# Patient Record
Sex: Male | Born: 1975 | Race: White | Hispanic: No | Marital: Married | Smoking: Former smoker
Health system: Southern US, Community
[De-identification: ages and names within clinical notes are randomized; demographics above are authoritative.]

## PROBLEM LIST (undated history)

## (undated) DIAGNOSIS — G4733 Obstructive sleep apnea (adult) (pediatric): Secondary | ICD-10-CM

## (undated) HISTORY — DX: Obstructive sleep apnea (adult) (pediatric): G47.33

## (undated) HISTORY — PX: ABDOMINAL PERINEAL BOWEL RESECTION: SHX1111

---

## 1999-05-05 DIAGNOSIS — C19 Malignant neoplasm of rectosigmoid junction: Secondary | ICD-10-CM

## 1999-05-05 HISTORY — DX: Malignant neoplasm of rectosigmoid junction: C19

## 1999-05-05 HISTORY — PX: PORTACATH PLACEMENT: SHX2246

## 2016-06-16 DIAGNOSIS — J029 Acute pharyngitis, unspecified: Secondary | ICD-10-CM | POA: Diagnosis not present

## 2016-06-16 DIAGNOSIS — R05 Cough: Secondary | ICD-10-CM | POA: Diagnosis not present

## 2016-06-16 DIAGNOSIS — J069 Acute upper respiratory infection, unspecified: Secondary | ICD-10-CM | POA: Diagnosis not present

## 2016-06-21 DIAGNOSIS — H60393 Other infective otitis externa, bilateral: Secondary | ICD-10-CM | POA: Diagnosis not present

## 2016-12-22 DIAGNOSIS — B353 Tinea pedis: Secondary | ICD-10-CM | POA: Diagnosis not present

## 2016-12-25 DIAGNOSIS — Z79899 Other long term (current) drug therapy: Secondary | ICD-10-CM | POA: Diagnosis not present

## 2016-12-25 DIAGNOSIS — Z85038 Personal history of other malignant neoplasm of large intestine: Secondary | ICD-10-CM | POA: Diagnosis not present

## 2016-12-25 DIAGNOSIS — B353 Tinea pedis: Secondary | ICD-10-CM | POA: Diagnosis not present

## 2017-05-27 DIAGNOSIS — M25561 Pain in right knee: Secondary | ICD-10-CM | POA: Diagnosis not present

## 2017-05-27 DIAGNOSIS — M779 Enthesopathy, unspecified: Secondary | ICD-10-CM | POA: Diagnosis not present

## 2017-05-27 DIAGNOSIS — B353 Tinea pedis: Secondary | ICD-10-CM | POA: Diagnosis not present

## 2018-04-18 DIAGNOSIS — R454 Irritability and anger: Secondary | ICD-10-CM | POA: Diagnosis not present

## 2018-05-18 DIAGNOSIS — Z13220 Encounter for screening for lipoid disorders: Secondary | ICD-10-CM | POA: Diagnosis not present

## 2018-05-18 DIAGNOSIS — Z0000 Encounter for general adult medical examination without abnormal findings: Secondary | ICD-10-CM | POA: Diagnosis not present

## 2018-09-02 DIAGNOSIS — H60392 Other infective otitis externa, left ear: Secondary | ICD-10-CM | POA: Diagnosis not present

## 2018-09-02 DIAGNOSIS — H6122 Impacted cerumen, left ear: Secondary | ICD-10-CM | POA: Diagnosis not present

## 2018-09-02 DIAGNOSIS — I499 Cardiac arrhythmia, unspecified: Secondary | ICD-10-CM | POA: Diagnosis not present

## 2019-05-05 DIAGNOSIS — I4891 Unspecified atrial fibrillation: Secondary | ICD-10-CM

## 2019-05-05 HISTORY — DX: Unspecified atrial fibrillation: I48.91

## 2019-05-10 ENCOUNTER — Telehealth: Payer: Self-pay | Admitting: *Deleted

## 2019-05-10 NOTE — Telephone Encounter (Addendum)
  needs appt with DOD tomorrow 05/11/19 Received: Today Message Contents  Jerline Pain, MD  [facility] [address] Scheduling; Nuala Alpha, LPN  Hey. Spoke to Arta Silence MD with [facility].  Needs an appt tomorrow with DOD secondary to elevated heart rate post colonoscopy.  He is asymptomatic, no CP.  I asked Dr. Paulita Fujita to give him metoprolol tartrate 25mg  PO BID.  His phone number is [phone].  Please call him after 4:20pm. Thanks.   Candee Furbish, MD

## 2019-05-10 NOTE — Telephone Encounter (Signed)
Left the pt a message to call the office back tomorrow morning for further assistance with getting an appt made on the DOD's schedule, as indicated in this note per Dr. Marlou Porch.  Per Dr. Marlou Porch, pt needed to be placed on Dr. Camillia Herter DOD Schedule for tomorrow 05/11/19, for elevated HR post colonoscopy. Left the pt a detailed message to call the office back for further assistance with this appt.  Once appt is made, a message should be sent to chart prep so that records can be obtained.  Will send this message to Dr. Marlou Porch RN, and Dr. Camillia Herter RN, for further follow-up of this pt when he returns a call back.  Pts contact information is [phone]. Triage to be CC'ed in on this message as well.

## 2019-05-11 ENCOUNTER — Other Ambulatory Visit: Payer: Self-pay

## 2019-05-11 ENCOUNTER — Ambulatory Visit: Payer: 59 | Admitting: Cardiovascular Disease

## 2019-05-11 VITALS — BP 132/90 | HR 86 | Ht 76.0 in | Wt 244.8 lb

## 2019-05-11 DIAGNOSIS — R000 Tachycardia, unspecified: Secondary | ICD-10-CM | POA: Diagnosis not present

## 2019-05-11 NOTE — Telephone Encounter (Signed)
[name] in scheduling dept spoke with the Dylan Bennett this morning and he is scheduled as a new patient, to come in and see DOD Dr. Angelena Form today 1/7 at 3 pm.   Dylan Bennett is aware of appt date and time.  Chart prep was notified to obtain records STAT for Dr. Angelena Form to have for this afternoon when seeing the Dylan Bennett.  Dr. Marlou Porch advised this Dylan Bennett to be added on to today's DOD schedule as a new Dylan Bennett.  Dylan Bennett is being referred by Dr. Paulita Fujita with [facility] for elevated HR post-colonoscopy yesterday.  This information is indicated in this note.  Will forward this information to Dr. Camillia Herter RN as a general FYI, to make her aware of DOD add on for this afternoon.

## 2019-05-11 NOTE — Patient Instructions (Signed)
Medication Instructions:  No changes today *If you need a refill on your cardiac medications before your next appointment, please call your pharmacy*  Lab Work: Today: bmet, tsh If you have labs (blood work) drawn today and your tests are completely normal, you will receive your results only by: Marland Kitchen MyChart Message (if you have MyChart) OR . A paper copy in the mail If you have any lab test that is abnormal or we need to change your treatment, we will call you to review the results.  Testing/Procedures: Your physician has requested that you have an echocardiogram. Echocardiography is a painless test that uses sound waves to create images of your heart. It provides your doctor with information about the size and shape of your heart and how well your heart's chambers and valves are working. This procedure takes approximately one hour. There are no restrictions for this procedure.  Your physician has recommended that you wear a Zio heart monitor. Heart monitors are medical devices that record the heart's electrical activity. Doctors most often use these monitors to diagnose arrhythmias. Arrhythmias are problems with the speed or rhythm of the heartbeat. The monitor is a small, patch. You can wear one while you do your normal daily activities. This is usually used to diagnose what is causing palpitations/syncope (passing out).   Follow-Up: At [facility], you and your health needs are our priority.  As part of our continuing mission to provide you with exceptional heart care, we have created designated Provider Care Teams.  These Care Teams include your primary Cardiologist (physician) and Advanced Practice Providers (APPs -  Physician Assistants and Nurse Practitioners) who all work together to provide you with the care you need, when you need it.  Your next appointment:   4 week(s)  The format for your next appointment:   In Person  Provider:   Lauree Chandler, MD  Other  Instructions

## 2019-05-11 NOTE — Progress Notes (Signed)
Chief Complaint  Patient presents with  . New Patient (Initial Visit)    tachycardia   History of Present Illness:44 yo male with history of colon cancer s/p colectomy in 2001 who is here today as a new patient for evaluation of tachycardia noted during his colonoscopy yesterday. Routine screening colonoscopy yesterday by Dr. Paulita Fujita with [facility]. He completed his bowel prep and had 10 beers the day before the procedure. Noted to have elevated heart rate during procedure. He has a strip printed out that shows a narrow complex irregular rhythm. Difficult to see clear p waves. This appears to be atrial fibrillation. No palpitations at home. He was started on metoprolol 25 mg BID last night. He is feeling well overall. He has some numbness at times in his left arm when driving. No exertional chest pain or dyspnea. He is very active. He drinks beer on a daily basis. No recent tobacco abuse. No thyroid issues. No palpitations at home.   Primary Care Physician: Kieth Brightly  Past Medical History:  Diagnosis Date  . Colorectal cancer ([place]) 2001    Past Surgical History:  Procedure Laterality Date  . ABDOMINAL PERINEAL BOWEL RESECTION      Current Outpatient Medications  Medication Sig Dispense Refill  . amphetamine-dextroamphetamine (ADDERALL XR) 25 MG 24 hr capsule Take 25 mg by mouth every morning.    . Ascorbic Acid (VITAMIN C) 1000 MG tablet Take 1,000 mg by mouth daily.    . metoprolol tartrate (LOPRESSOR) 25 MG tablet Take 25 mg by mouth 2 (two) times daily.    . Multiple Vitamin (MULTIVITAMIN PO) Take 1 tablet by mouth daily.     . sertraline (ZOLOFT) 50 MG tablet Take 50 mg by mouth daily.    . sildenafil (VIAGRA) 100 MG tablet Take 50-100 mg by mouth daily as needed for erectile dysfunction.     No current facility-administered medications for this visit.    Allergies  Allergen Reactions  . Penicillins     rash    Social History   Socioeconomic History  .  Marital status: Married    Spouse name: Not on file  . Number of children: Not on file  . Years of education: Not on file  . Highest education level: Not on file  Occupational History  . Not on file  Tobacco Use  . Smoking status: Former Research scientist (life sciences)  . Smokeless tobacco: Never Used  Substance and Sexual Activity  . Alcohol use: Yes  . Drug use: Never  . Sexual activity: Not on file  Other Topics Concern  . Not on file  Social History Narrative  . Not on file   Social Determinants of Health   Financial Resource Strain:   . Difficulty of Paying Living Expenses: Not on file  Food Insecurity:   . Worried About Charity fundraiser in the Last Year: Not on file  . Ran Out of Food in the Last Year: Not on file  Transportation Needs:   . Lack of Transportation (Medical): Not on file  . Lack of Transportation (Non-Medical): Not on file  Physical Activity:   . Days of Exercise per Week: Not on file  . Minutes of Exercise per Session: Not on file  Stress:   . Feeling of Stress : Not on file  Social Connections:   . Frequency of Communication with Friends and Family: Not on file  . Frequency of Social Gatherings with Friends and Family: Not on file  .  Attends Religious Services: Not on file  . Active Member of Clubs or Organizations: Not on file  . Attends Archivist Meetings: Not on file  . Marital Status: Not on file  Intimate Partner Violence:   . Fear of Current or Ex-Partner: Not on file  . Emotionally Abused: Not on file  . Physically Abused: Not on file  . Sexually Abused: Not on file    Family History  Problem Relation Age of Onset  . Hypotension Mother   . Heart attack Father     Review of Systems:  As stated in the HPI and otherwise negative.   BP 132/90   Pulse 86   Ht 6\' 4"  (1.93 m)   Wt 244 lb 12.8 oz (111 kg)   SpO2 98%   BMI 29.80 kg/m   Physical Examination: General: Well developed, well nourished, NAD  HEENT: OP clear, mucus membranes moist   SKIN: warm, dry. No rashes. Neuro: No focal deficits  Musculoskeletal: Muscle strength 5/5 all ext  Psychiatric: Mood and affect normal  Neck: No JVD, no carotid bruits, no thyromegaly, no lymphadenopathy.  Lungs:Clear bilaterally, no wheezes, rhonci, crackles Cardiovascular: Regular rate and rhythm. No murmurs, gallops or rubs. Abdomen:Soft. Bowel sounds present. Non-tender.  Extremities: No lower extremity edema. Pulses are 2 + in the bilateral DP/PT.  EKG:  EKG is ordered today. The ekg ordered today demonstrates Normal sinus rhythm  Recent Labs: No results found for requested labs within last 8760 hours.   Lipid Panel No results found for: CHOL, TRIG, HDL, CHOLHDL, VLDL, LDLCALC, LDLDIRECT   Wt Readings from Last 3 Encounters:  05/11/19 244 lb 12.8 oz (111 kg)       Assessment and Plan:   1. Tachycardia: Limited strip from GI reviewed. Irregular narrow complex rhythm. This appears to be atrial fibrillation. CHADS VASC score 0. Will arrange 14 day cardiac monitor. Will arrange echo. Will arrange BMET and TSH.   Current medicines are reviewed at length with the patient today.  The patient does not have concerns regarding medicines.  The following changes have been made:  no change  Labs/ tests ordered today include:   Orders Placed This Encounter  Procedures  . LONG TERM MONITOR (3-14 DAYS)  . EKG 12-Lead  . ECHOCARDIOGRAM COMPLETE     Disposition:   FU with me in 4 weeks.    Signed, Lauree Chandler, MD 05/11/2019 4:01 PM    [facility] [address], [place], [place]  [address] Phone: [phone]; Fax: [phone]

## 2019-05-12 ENCOUNTER — Ambulatory Visit (HOSPITAL_COMMUNITY): Payer: 59 | Attending: Internal Medicine

## 2019-05-12 ENCOUNTER — Other Ambulatory Visit (HOSPITAL_COMMUNITY): Payer: 59

## 2019-05-12 DIAGNOSIS — R000 Tachycardia, unspecified: Secondary | ICD-10-CM | POA: Diagnosis present

## 2019-05-17 ENCOUNTER — Ambulatory Visit: Payer: Self-pay | Admitting: Cardiovascular Disease

## 2019-05-17 ENCOUNTER — Other Ambulatory Visit (INDEPENDENT_AMBULATORY_CARE_PROVIDER_SITE_OTHER): Payer: 59

## 2019-05-17 DIAGNOSIS — R000 Tachycardia, unspecified: Secondary | ICD-10-CM | POA: Diagnosis not present

## 2019-05-19 ENCOUNTER — Other Ambulatory Visit: Payer: Self-pay | Admitting: *Deleted

## 2019-05-19 DIAGNOSIS — I77810 Thoracic aortic ectasia: Secondary | ICD-10-CM

## 2019-06-08 ENCOUNTER — Telehealth: Payer: Self-pay | Admitting: Cardiovascular Disease

## 2019-06-08 NOTE — Telephone Encounter (Signed)
New Message     Pt is calling and is wanting to know if his appt on 02/11 can be virtual because he is traveling around for work and just started this new job     Please advise

## 2019-06-08 NOTE — Telephone Encounter (Signed)
Message to medical records to get update on 14 day monitor.

## 2019-06-09 ENCOUNTER — Other Ambulatory Visit: Payer: Self-pay

## 2019-06-09 ENCOUNTER — Other Ambulatory Visit: Payer: 59 | Admitting: *Deleted

## 2019-06-09 ENCOUNTER — Telehealth: Payer: Self-pay | Admitting: Cardiovascular Disease

## 2019-06-09 DIAGNOSIS — R000 Tachycardia, unspecified: Secondary | ICD-10-CM

## 2019-06-09 NOTE — Telephone Encounter (Signed)
Spoke with Raquel Sarna at [facility] who reviewed that ZIO report is available on line.  Pt wore zio from Jan 13-26.  He was in afib/flutter 33% of the time.  Fastest was afib 275 bpm on 1/17 at 1:08 pm (this is on strip 7)  Last visit note indciates CHADS VASC is 0.  Pt scheduled with Dr. Angelena Form on 06/12/19. His echo is done but I do not see lab results. Called patient.  He will come by today for bmet, tsh.  Requests when he is here to get copy of progress note from visit on 1/7 and echo report on 1/8.  Adv him to ask for medical records.  Will send them a message to see if they can have ready for this afternoon.

## 2019-06-09 NOTE — Telephone Encounter (Signed)
Raquel Sarna, with [employer], is calling to read abnormal Zio Patch findings. Please return call at [phone].   Reference #: [id number]

## 2019-06-09 NOTE — Telephone Encounter (Signed)
Pt aware I will review w Dr. Angelena Form and will let him know.

## 2019-06-10 LAB — BASIC METABOLIC PANEL
BUN/Creatinine Ratio: 20 (ref 9–20)
BUN: 24 mg/dL (ref 6–24)
CO2: 21 mmol/L (ref 20–29)
Calcium: 9.2 mg/dL (ref 8.7–10.2)
Chloride: 110 mmol/L — ABNORMAL HIGH (ref 96–106)
Creatinine, Ser: 1.23 mg/dL (ref 0.76–1.27)
GFR calc Af Amer: 82 mL/min/{1.73_m2} (ref >59)
GFR calc non Af Amer: 71 mL/min/{1.73_m2} (ref >59)
Glucose: 79 mg/dL (ref 65–99)
Potassium: 3.9 mmol/L (ref 3.5–5.2)
Sodium: 142 mmol/L (ref 134–144)

## 2019-06-10 LAB — TSH: TSH: 1.04 u[IU]/mL (ref 0.450–4.500)

## 2019-06-11 NOTE — Telephone Encounter (Signed)
A virtual visit will be ok. I can review his monitor and labs with him then.

## 2019-06-12 ENCOUNTER — Ambulatory Visit: Payer: 59 | Admitting: Cardiovascular Disease

## 2019-06-12 NOTE — Telephone Encounter (Signed)
Contacted patient and informed virtual visit is ok with Dr. Angelena Form.  Explained process for virtual visit (VIDEO). Pt appreciative for this.     Virtual Visit Pre-Appointment Phone Call  "(Name), I am calling you today to discuss your upcoming appointment. We are currently trying to limit exposure to the virus that causes COVID-19 by seeing patients at home rather than in the office."  1. "What is the BEST phone number to call the day of the visit?" - include this in appointment notes  2. "Do you have or have access to (through a family member/friend) a smartphone with video capability that we can use for your visit?" a. If yes - list this number in appt notes as "cell" (if different from BEST phone #) and list the appointment type as a VIDEO visit in appointment notes b. If no - list the appointment type as a PHONE visit in appointment notes  3. Confirm consent - "In the setting of the current Covid19 crisis, you are scheduled for a (phone or video) visit with your provider on (date) at (time).  Just as we do with many in-office visits, in order for you to participate in this visit, we must obtain consent.  If you'd like, I can send this to your mychart (if signed up) or email for you to review.  Otherwise, I can obtain your verbal consent now.  All virtual visits are billed to your insurance company just like a normal visit would be.  By agreeing to a virtual visit, we'd like you to understand that the technology does not allow for your provider to perform an examination, and thus may limit your provider's ability to fully assess your condition. If your provider identifies any concerns that need to be evaluated in person, we will make arrangements to do so.  Finally, though the technology is pretty good, we cannot assure that it will always work on either your or our end, and in the setting of a video visit, we may have to convert it to a phone-only visit.  In either situation, we cannot ensure  that we have a secure connection.  Are you willing to proceed?" STAFF: Did the patient verbally acknowledge consent to telehealth visit? Document YES/NO here: YES  4. Advise patient to be prepared - "Two hours prior to your appointment, go ahead and check your blood pressure, pulse, oxygen saturation, and your weight (if you have the equipment to check those) and write them all down. When your visit starts, your provider will ask you for this information. If you have an Apple Watch or Kardia device, please plan to have heart rate information ready on the day of your appointment. Please have a pen and paper handy nearby the day of the visit as well."  5. Give patient instructions for MyChart download to smartphone OR [email or web address] as below if video visit (depending on what platform provider is using)  6. Inform patient they will receive a phone call 15 minutes prior to their appointment time (may be from unknown caller ID) so they should be prepared to answer    TELEPHONE CALL NOTE  Dylan Bennett has been deemed a candidate for a follow-up tele-health visit to limit community exposure during the Covid-19 pandemic. I spoke with the patient via phone to ensure availability of phone/video source, confirm preferred email & phone number, and discuss instructions and expectations.  I reminded Dylan Bennett to be prepared with any vital sign and/or heart rhythm information that could potentially be  obtained via home monitoring, at the time of his visit. I reminded Dylan Bennett to expect a phone call prior to his visit.  [name], RN 06/12/2019 9:42 AM   IF USING DOXIMITY or DOXY.ME - The patient will receive a link just prior to their visit by text.     FULL LENGTH CONSENT FOR TELE-HEALTH VISIT   I hereby voluntarily request, consent and authorize [facility] and its employed or contracted physicians, physician assistants, nurse practitioners or other licensed health care  professionals (the Practitioner), to provide me with telemedicine health care services (the "Services") as deemed necessary by the treating Practitioner. I acknowledge and consent to receive the Services by the Practitioner via telemedicine. I understand that the telemedicine visit will involve communicating with the Practitioner through live audiovisual communication technology and the disclosure of certain medical information by electronic transmission. I acknowledge that I have been given the opportunity to request an in-person assessment or other available alternative prior to the telemedicine visit and am voluntarily participating in the telemedicine visit.  I understand that I have the right to withhold or withdraw my consent to the use of telemedicine in the course of my care at any time, without affecting my right to future care or treatment, and that the Practitioner or I may terminate the telemedicine visit at any time. I understand that I have the right to inspect all information obtained and/or recorded in the course of the telemedicine visit and may receive copies of available information for a reasonable fee.  I understand that some of the potential risks of receiving the Services via telemedicine include:  Marland Kitchen Delay or interruption in medical evaluation due to technological equipment failure or disruption; . Information transmitted may not be sufficient (e.g. poor resolution of images) to allow for appropriate medical decision making by the Practitioner; and/or  . In rare instances, security protocols could fail, causing a breach of personal health information.  Furthermore, I acknowledge that it is my responsibility to provide information about my medical history, conditions and care that is complete and accurate to the best of my ability. I acknowledge that Practitioner's advice, recommendations, and/or decision may be based on factors not within their control, such as incomplete or inaccurate  data provided by me or distortions of diagnostic images or specimens that may result from electronic transmissions. I understand that the practice of medicine is not an exact science and that Practitioner makes no warranties or guarantees regarding treatment outcomes. I acknowledge that I will receive a copy of this consent concurrently upon execution via email to the email address I last provided but may also request a printed copy by calling the office of [facility].    I understand that my insurance will be billed for this visit.   I have read or had this consent read to me. . I understand the contents of this consent, which adequately explains the benefits and risks of the Services being provided via telemedicine.  . I have been provided ample opportunity to ask questions regarding this consent and the Services and have had my questions answered to my satisfaction. . I give my informed consent for the services to be provided through the use of telemedicine in my medical care  By participating in this telemedicine visit I agree to the above.

## 2019-06-15 ENCOUNTER — Encounter: Payer: Self-pay | Admitting: Cardiovascular Disease

## 2019-06-15 ENCOUNTER — Other Ambulatory Visit: Payer: Self-pay

## 2019-06-15 ENCOUNTER — Telehealth (INDEPENDENT_AMBULATORY_CARE_PROVIDER_SITE_OTHER): Payer: 59 | Admitting: Cardiovascular Disease

## 2019-06-15 VITALS — Ht 76.0 in | Wt 240.0 lb

## 2019-06-15 DIAGNOSIS — I4891 Unspecified atrial fibrillation: Secondary | ICD-10-CM

## 2019-06-15 DIAGNOSIS — I480 Paroxysmal atrial fibrillation: Secondary | ICD-10-CM | POA: Diagnosis not present

## 2019-06-15 NOTE — Patient Instructions (Signed)
Medication Instructions:  No changes today *If you need a refill on your cardiac medications before your next appointment, please call your pharmacy*  Lab Work: none If you have labs (blood work) drawn today and your tests are completely normal, you will receive your results only by: Marland Kitchen MyChart Message (if you have MyChart) OR . A paper copy in the mail If you have any lab test that is abnormal or we need to change your treatment, we will call you to review the results.  Testing/Procedures: none   Other Instructions You have been referred to Electrophysiology --Dr. [name]

## 2019-06-15 NOTE — Progress Notes (Signed)
Virtual Visit via Video Note   This visit type was conducted due to national recommendations for restrictions regarding the COVID-19 Pandemic (e.g. social distancing) in an effort to limit this patient's exposure and mitigate transmission in our community.  Due to his co-morbid illnesses, this patient is at least at moderate risk for complications without adequate follow up.  This format is felt to be most appropriate for this patient at this time.  All issues noted in this document were discussed and addressed.  A limited physical exam was performed with this format.  Please refer to the patient's chart for his consent to telehealth for [facility].   Date:  06/15/2019   ID:  Dylan Bennett, DOB 1976/04/12, MRN [id number]  Patient Location: Home Provider Location: Office  PCP:  Aurea Graff, PA-C  Cardiologist:  No primary care provider on file.  Electrophysiologist:  None   Evaluation Performed:  Follow-Up Visit  Chief Complaint:  Follow up- Atrial fibrillation  History of Present Illness:    Dylan Bennett is a 44 y.o. male with history of colon cancer s/p colectomy in 2001 and recent finding of atrial fibrillation who is being seen today by virtual e-visit due to the Covid19 pandemic. He was found to have atrial fibrillation during his colonoscopy on 05/10/19. He had 10 beers the day before his procedure while doing his bowel prep. He was started on metoprolol 25 mg BID. He had no worrisome symptoms when I saw him last month. Echo 05/12/19 with LVEF=50-55%,  No valve disease.  Cardiac monitor with atrial fib (33% burden) but also nocturnal bradycardia with heart rate into the 40s. Some of the rhythm strips are narrow complex with a rate over 250 bpm, Cannot exclude SVT.   The patient denies chest pain, dyspnea, palpitations, dizziness, near syncope or syncope. No lower extremity edema. He has been working. He has no awareness during the day that his heart is irregular. He does  notice his heart skipping some at night. He takes Adderall durin the week.   The patient does not have symptoms concerning for COVID-19 infection (fever, chills, cough, or new shortness of breath).    Past Medical History:  Diagnosis Date  . Atrial fibrillation ([place])   . Colorectal cancer ([place]) 2001   Past Surgical History:  Procedure Laterality Date  . ABDOMINAL PERINEAL BOWEL RESECTION       Current Meds  Medication Sig  . amphetamine-dextroamphetamine (ADDERALL XR) 25 MG 24 hr capsule Take 25 mg by mouth every morning.  . Ascorbic Acid (VITAMIN C) 1000 MG tablet Take 1,000 mg by mouth daily.  . Multiple Vitamin (MULTIVITAMIN PO) Take 1 tablet by mouth daily.   . sertraline (ZOLOFT) 50 MG tablet Take 50 mg by mouth daily.  . sildenafil (VIAGRA) 100 MG tablet Take 50-100 mg by mouth daily as needed for erectile dysfunction.     Allergies:   Penicillins   Social History   Tobacco Use  . Smoking status: Former Research scientist (life sciences)  . Smokeless tobacco: Never Used  Substance Use Topics  . Alcohol use: Yes  . Drug use: Never     Family Hx: The patient's family history includes Heart attack in his father; Hypotension in his mother.  ROS:   Please see the history of present illness.    All other systems reviewed and are negative.   Prior CV studies:   The following studies were reviewed today:  Echo 05/12/19:  1. Left ventricular ejection fraction,  by visual estimation, is 50 to  55%. The left ventricle has low normal function. There is no left  ventricular hypertrophy. GLS -18.4%.  2. Left ventricular diastolic parameters are consistent with Grade I  diastolic dysfunction (impaired relaxation).  3. Mildly dilated left ventricular internal cavity size.  4. The left ventricle has no regional wall motion abnormalities.  5. Global right ventricle has normal systolic function.The right  ventricular size is normal. No increase in right ventricular wall  thickness.  6. Left  atrial size was normal.  7. Right atrial size was normal.  8. The mitral valve is normal in structure. Trivial mitral valve  regurgitation. No evidence of mitral stenosis.  9. The tricuspid valve is normal in structure.  10. The aortic valve is tricuspid. Aortic valve regurgitation is not  visualized. No evidence of aortic valve sclerosis or stenosis.  11. There is mild dilatation of the ascending aorta measuring 40 mm.  12. The tricuspid regurgitant velocity is 2.09 m/s, and with an assumed  right atrial pressure of 3 mmHg, the estimated right ventricular systolic  pressure is normal at 20.5 mmHg.  13. The inferior vena cava is normal in size with greater than 50%  respiratory variability, suggesting right atrial pressure of 3 mmHg.   Labs/Other Tests and Data Reviewed:    EKG:  No ECG reviewed.  Recent Labs: 06/09/2019: BUN 24; Creatinine, Ser 1.23; Potassium 3.9; Sodium 142; TSH 1.040   Recent Lipid Panel No results found for: CHOL, TRIG, HDL, CHOLHDL, LDLCALC, LDLDIRECT  Wt Readings from Last 3 Encounters:  06/15/19 240 lb (108.9 kg)  05/11/19 244 lb 12.8 oz (111 kg)     Objective:    Vital Signs:  Ht 6\' 4"  (1.93 m)   Wt 240 lb (108.9 kg)   BMI 29.21 kg/m    VITAL SIGNS:  reviewed GEN:    ASSESSMENT & PLAN:    1. Atrial fibrillation, paroxysmal: Recent cardiac monitor with 33% burden of atrial fibrillation. Cannot exclude SVT with some rates over 250 bpm.  He has nocturnal bradycardia with heart rates in the 40s. He has not used his metoprolol over the past month. LV function normal on echo. Given his tachycardia with nighttime bradycardia, I am going to refer him to EP to discuss options. He is not symptomatic with his atrial arrhythmia. CHADS  VASC score of 0. Anti-coagulation is not indicated.   COVID-19 Education: The signs and symptoms of COVID-19 were discussed with the patient and how to seek care for testing (follow up with PCP or arrange E-visit).  The  importance of social distancing was discussed today.  Time:   Today, I have spent 15 minutes with the patient with telehealth technology discussing the above problems.     Medication Adjustments/Labs and Tests Ordered: Current medicines are reviewed at length with the patient today.  Concerns regarding medicines are outlined above.   Tests Ordered: Orders Placed This Encounter  Procedures  . Ambulatory referral to Cardiac Electrophysiology    Medication Changes: No orders of the defined types were placed in this encounter.   Disposition:  Will arrange EP consultation  Signed, Lauree Chandler, MD  06/15/2019 4:55 PM    [place]

## 2019-06-22 ENCOUNTER — Other Ambulatory Visit: Payer: Self-pay

## 2019-06-22 ENCOUNTER — Telehealth: Payer: 59 | Admitting: Internal Medicine

## 2019-06-26 ENCOUNTER — Telehealth (INDEPENDENT_AMBULATORY_CARE_PROVIDER_SITE_OTHER): Payer: 59 | Admitting: Internal Medicine

## 2019-06-26 ENCOUNTER — Other Ambulatory Visit: Payer: Self-pay

## 2019-06-26 ENCOUNTER — Encounter: Payer: Self-pay | Admitting: Internal Medicine

## 2019-06-26 VITALS — Ht 76.0 in | Wt 240.0 lb

## 2019-06-26 DIAGNOSIS — R0683 Snoring: Secondary | ICD-10-CM

## 2019-06-26 DIAGNOSIS — I480 Paroxysmal atrial fibrillation: Secondary | ICD-10-CM

## 2019-06-26 MED ORDER — DILTIAZEM HCL ER COATED BEADS 120 MG PO CP24: 120.0000 mg | ORAL_CAPSULE | Freq: Every day | ORAL | 3 refills | Status: DC

## 2019-06-26 NOTE — Progress Notes (Signed)
Electrophysiology TeleHealth Note   Due to national recommendations of social distancing due to [place] 19, Audio telehealth visit is felt to be most appropriate for this patient at this time.  See MyChart message from today for patient consent regarding telehealth for [facility].   Date:  06/26/2019   ID:  Dylan Bennett, DOB 07-10-1975, MRN [id number]  Location: home Provider location: [place] Evaluation Performed: New patient consult  PCP:  Aurea Graff, PA-C  Cardiologist:  Dr Angelena Form Electrophysiologist:  None   Chief Complaint:  Atrial fibrillation  History of Present Illness:    Dylan Bennett is a 44 y.o. male who presents via audio conferencing for a telehealth visit today.   The patient is referred for new consultation regarding atrial fibrillation by Dr Angelena Form.   The patient has a h/o colon cancer in 2001.  He presented in January for routine colonoscopy and was noted on monitor to have afib.  He was referred to Dr Angelena Form and had 30 day monitor placed.  This demonstrated 33% afib burden with very elevated V rates and a regular tachycardia at 280 bpm.  He has a physical job and is very active.  He denies exertional limitation.  He is aware of occasional palpitations.     Today, he denies symptoms of chest pain, shortness of breath, lower extremity edema, dizziness, presyncope, syncope, bleeding, or neurologic sequela. The patient is tolerating medications without difficulties and is otherwise without complaint today.     Past Medical History:  Diagnosis Date  . Atrial fibrillation ([place]) 05/2019  . Colorectal cancer ([place]) 2001   treated with colon resection and permanent coloscopy.  He also had radiation and chemotherapy    Past Surgical History:  Procedure Laterality Date  . ABDOMINAL PERINEAL BOWEL RESECTION    . PORTACATH PLACEMENT  2001    Current Outpatient Medications  Medication Sig Dispense Refill  . amphetamine-dextroamphetamine  (ADDERALL XR) 25 MG 24 hr capsule Take 25 mg by mouth every morning.    . Ascorbic Acid (VITAMIN C) 1000 MG tablet Take 1,000 mg by mouth daily.    . Multiple Vitamin (MULTIVITAMIN PO) Take 1 tablet by mouth daily.     . sertraline (ZOLOFT) 50 MG tablet Take 50 mg by mouth daily.    . sildenafil (VIAGRA) 100 MG tablet Take 50-100 mg by mouth daily as needed for erectile dysfunction.    Marland Kitchen diltiazem (CARDIZEM CD) 120 MG 24 hr capsule Take 1 capsule (120 mg total) by mouth daily. 90 capsule 3   No current facility-administered medications for this visit.    Allergies:   Penicillins   Social History:  The patient  reports that he has quit smoking. He has never used smokeless tobacco. He reports current alcohol use. He reports that he does not use drugs.   Family History:  The patient's  family history includes Heart attack in his father; Hypotension in his mother.    ROS:  Please see the history of present illness.   All other systems are personally reviewed and negative.    Exam:    Vital Signs:  Ht 6\' 4"  (1.93 m)   Wt 240 lb (108.9 kg)   BMI 29.21 kg/m    Well sounding, alert and conversant, he sounds distracted and several times states that he is busy and does not have a lot of time for things because of work   Labs/Other Tests and Data Reviewed:    Recent Labs: 06/09/2019: BUN  24; Creatinine, Ser 1.23; Potassium 3.9; Sodium 142; TSH 1.040   Wt Readings from Last 3 Encounters:  06/26/19 240 lb (108.9 kg)  06/15/19 240 lb (108.9 kg)  05/11/19 244 lb 12.8 oz (111 kg)     Other studies personally reviewed: Additional studies/ records that were reviewed today include: Dr Camillia Herter notes,  Recent echo and event monitor Review of the above records today demonstrates: as above  Event monitor is personally reviewed which reveals afib burden of 33%.  V rates are frequently elevated with average V rate during afib of 128 bpm.  There is also a very regular tachycardia, either due to  SVT or 1:1 atrial flutter with V rates of 280 bpm.  Nocturnal bradycardia is also noted.  ASSESSMENT & PLAN:    1.  paroxsymal atrial fibrillation Event monitor is reviewed which reveals afib burden of 33%.  V rates are frequently elevated with average V rate during afib of 128 bpm.  There is also a very regular tachycardia, either due to SVT or 1:1 atrial flutter with V rates of 280 bpm.   We discussed options of rate control, AADs, and ablation at length today.  He states "I cannot take off of work right now for any procedures.  I just started a new job. Start diltiazem CD 120mg  daily.  Consider ILR vs [employer] depending on his level of interested at follow-up.  I would not advise additional 30 day monitors. We could also consider flecainide depending on his tolerance of diltiazem. Eventually, he may be willing to consider ablation. We discussed lifestyle modification at length today including ETOH reduction and evaluation snoring.  2. Snoring He snores and has nocturnal bradycardia Will order a sleep study.  He would prefer a home sleep study given his work requirements.  3 ETOH avoidance encouraged  Follow-up:  6 weeks in the AF clnic  Current medicines are reviewed at length with the patient today.   The patient does not have concerns regarding his medicines.  The following changes were made today:  none  Labs/ tests ordered today include:  No orders of the defined types were placed in this encounter.   Patient Risk:  after full review of this patients clinical status, I feel that they are at moderate risk at this time.   Today, I have spent 20 minutes with the patient with telehealth technology discussing afib .    Army Fossa MD, [facility] 06/26/2019 3:00 PM   [place] [address] [place] [address] [phone] (office) [phone] (fax)

## 2019-06-27 ENCOUNTER — Telehealth: Payer: Self-pay

## 2019-06-27 DIAGNOSIS — R0683 Snoring: Secondary | ICD-10-CM

## 2019-06-27 NOTE — Telephone Encounter (Signed)
-----   Message from [name], MD sent at 06/26/2019  2:46 PM EST ----- Sonia Baller, Please order a sleep study, ok to do with Dr Radford Pax.  Either take home or in clinic... up to her  Dylan Bennett, Follow-up in AF clinic in 6 weeks

## 2019-06-28 NOTE — Telephone Encounter (Signed)
Left detailed message for Pt.  Need to determine if Pt would like to do a HOME SLEEP STUDY or an [place].  Advised Pt to call back to office and advise which type Pt would like.  Will follow up next day in office (Friday 2/26).

## 2019-07-05 ENCOUNTER — Telehealth (HOSPITAL_COMMUNITY): Payer: Self-pay | Admitting: Nurse Practitioner

## 2019-07-05 NOTE — Telephone Encounter (Signed)
Call placed to Pt.  Pt to busy to talk right now, but is interested in [place] sleep study.  Sent Pt MyChart link to set up.  Will follow up.  Need STOPBANG questions.

## 2019-07-05 NOTE — Telephone Encounter (Signed)
Per staff message from Myrtie Hawk, RN, pt needs 6 wk f/u with AFib Clinic from his 06/26/19 Virtual appt with Dr. Rayann Heman

## 2019-07-10 NOTE — Telephone Encounter (Signed)
Pt has smart phone and would like to use disposable sleep test.    Patient Name: Dylan Bennett        DOB: 1976/02/25      Height: 6'4"    Weight: 240 lbs.  Office Name: [facility]         Referring Provider:  [name], MD  Today's Date:  07/10/2019  Date:  07/10/2019 STOP BANG RISK ASSESSMENT S (snore) Have you been told that you snore?     YES   T (tired) Are you often tired, fatigued, or sleepy during the day?   NO  O (obstruction) Do you stop breathing, choke, or gasp during sleep? NO   P (pressure) Do you have or are you being treated for high blood pressure? NO   B (BMI) Is your body index greater than 35 kg/m? NO   A (age) Are you 34 years old or older? NO   N (neck) Do you have a neck circumference greater than 16 inches?   YES   G (gender) Are you a male? YES   TOTAL STOP/BANG "YES" ANSWERS                                                                        For Office Use Only              Procedure Order Form    YES to 3+ Stop Bang questions OR two clinical symptoms - patient qualifies for WatchPAT (CPT 95800)     Submit: This Form + Patient Face Sheet + Clinical Note via CloudPAT or Fax: [phone]         Clinical Notes: Will consult Sleep Specialist and refer for management of therapy due to patient increased risk of Sleep Apnea. Ordering a sleep study due to the following two clinical symptoms: Excessive daytime sleepiness G47.10 / Gastroesophageal reflux K21.9 / Nocturia R35.1 / Morning Headaches G44.221 / Difficulty concentrating R41.840 / Memory problems or poor judgment G31.84 / Personality changes or irritability R45.4 / Loud snoring R06.83 / Depression F32.9 / Unrefreshed by sleep G47.8 / Impotence N52.9 / History of high blood pressure R03.0 / Insomnia G47.00    I understand that I am proceeding with a home sleep apnea test as ordered by my treating physician. I understand that untreated sleep apnea is a serious cardiovascular risk factor  and it is my responsibility to perform the test and seek management for sleep apnea. I will be contacted with the results and be managed for sleep apnea by a local sleep physician. I will be receiving equipment and further instructions from [facility]. I shall promptly ship back the equipment via the included mailing label. I understand my insurance will be billed for the test and as the patient I am responsible for any insurance related out-of-pocket costs incurred. I have been provided with written instructions and can call for additional video or telephonic instruction, with 24-hour availability of qualified personnel to answer any questions: Patient Help Desk [phone].  Patient Signature ______________________________________________________   Date______________________ Patient Telemedicine Verbal Consent

## 2019-07-10 NOTE — Telephone Encounter (Signed)
Left message to call this nurse back.  Need to discuss stopbang questionairre with Pt.

## 2019-07-11 NOTE — Telephone Encounter (Signed)
Order, demographic, stopbang questionairre and clinical note faxed to [employer].

## 2019-07-11 NOTE — Telephone Encounter (Signed)
Pt has not responded to previous VM.  Called and left 2nd message for patient to call [facility] to schedule appt.

## 2019-07-11 NOTE — Telephone Encounter (Signed)
Pt returned my call and is aware of appt 4/8 @3 :30 with Roderic Palau, NP.

## 2019-07-12 NOTE — Telephone Encounter (Signed)
Spoke with pt and he is aware of appt on 08/10/19 with [facility].

## 2019-08-10 ENCOUNTER — Ambulatory Visit (HOSPITAL_COMMUNITY): Payer: 59 | Admitting: Nurse Practitioner

## 2019-08-18 ENCOUNTER — Other Ambulatory Visit: Payer: Self-pay

## 2019-08-18 ENCOUNTER — Ambulatory Visit (HOSPITAL_COMMUNITY)
Admission: RE | Admit: 2019-08-18 | Discharge: 2019-08-18 | Disposition: A | Discharge status: Home / Self Care | Payer: 59 | Source: Ambulatory Visit | Attending: Nurse Practitioner | Admitting: Nurse Practitioner

## 2019-08-18 VITALS — BP 150/86 | HR 125 | Ht 76.0 in | Wt 224.6 lb

## 2019-08-18 DIAGNOSIS — I480 Paroxysmal atrial fibrillation: Secondary | ICD-10-CM

## 2019-08-18 DIAGNOSIS — D6869 Other thrombophilia: Secondary | ICD-10-CM | POA: Diagnosis not present

## 2019-08-18 DIAGNOSIS — Z79899 Other long term (current) drug therapy: Secondary | ICD-10-CM | POA: Insufficient documentation

## 2019-08-18 DIAGNOSIS — Z87891 Personal history of nicotine dependence: Secondary | ICD-10-CM | POA: Insufficient documentation

## 2019-08-18 MED ORDER — FLECAINIDE ACETATE 50 MG PO TABS: 50.0000 mg | ORAL_TABLET | Freq: Two times a day (BID) | ORAL | 3 refills | Status: DC

## 2019-08-18 NOTE — Progress Notes (Signed)
Primary Care Physician: Aurea Graff, PA-C Referring Physician: Dr. Lamount Cohen Cardiologist: Dr. Kavin Leech Dylan Bennett is a 44 y.o. male with a h/o paroxymal afib that is here f/u from consult with Dr.Allred 06/26/19, for 33 % burden on monitor,with very elevated V rates and a regular tachycardia at 280 bpm.  He recommended ablation or flecainide if he tolerated the diltiazem that pt was placed on that visit. Pt did not want ablation at the time.   Today, he is in afib with v rate mid 120's. He is unaware. He has tolerated the diltiazem.  He obtained the home sleep study but he has not done this yet. Wife states witnessed snoring and apnea. He is not drinking excessive caffeine, but does drink 6+ beers a week maybe more on the weekend. Dr. Rayann Heman recommended getting a apple watch, KardiaMobile to track degree of afib, but he did not feel compelled to purchase. .   Today, he denies symptoms of palpitations, chest pain, shortness of breath, orthopnea, PND, lower extremity edema, dizziness, presyncope, syncope, or neurologic sequela. The patient is tolerating medications without difficulties and is otherwise without complaint today.   Past Medical History:  Diagnosis Date  . Atrial fibrillation ([place]) 05/2019  . Colorectal cancer ([place]) 2001   treated with colon resection and permanent coloscopy.  He also had radiation and chemotherapy   Past Surgical History:  Procedure Laterality Date  . ABDOMINAL PERINEAL BOWEL RESECTION    . PORTACATH PLACEMENT  2001    Current Outpatient Medications  Medication Sig Dispense Refill  . amphetamine-dextroamphetamine (ADDERALL XR) 25 MG 24 hr capsule Take 25 mg by mouth every morning.    . Ascorbic Acid (VITAMIN C) 1000 MG tablet Take 1,000 mg by mouth daily.    Marland Kitchen diltiazem (CARDIZEM CD) 120 MG 24 hr capsule Take 1 capsule (120 mg total) by mouth daily. 90 capsule 3  . Multiple Vitamin (MULTIVITAMIN PO) Take 1 tablet by mouth daily.     .  sertraline (ZOLOFT) 50 MG tablet Take 50 mg by mouth daily.    . sildenafil (VIAGRA) 100 MG tablet Take 50-100 mg by mouth daily as needed for erectile dysfunction.    . flecainide (TAMBOCOR) 50 MG tablet Take 1 tablet (50 mg total) by mouth 2 (two) times daily. 60 tablet 3   No current facility-administered medications for this encounter.    Allergies  Allergen Reactions  . Penicillins Rash    rash    Social History   Socioeconomic History  . Marital status: Married    Spouse name: Not on file  . Number of children: Not on file  . Years of education: Not on file  . Highest education level: Not on file  Occupational History  . Not on file  Tobacco Use  . Smoking status: Former Research scientist (life sciences)  . Smokeless tobacco: Never Used  Substance and Sexual Activity  . Alcohol use: Yes    Comment: none during the weekn, 6-12 beers on the weekend  . Drug use: Never  . Sexual activity: Not on file  Other Topics Concern  . 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 Difficulty of Paying Living Expenses:   Food Insecurity:   . Worried About [employer] of  Food in the Last Year:   . [place] in the Last Year:   Transportation Needs:   . Film/video editor (Medical):   Marland Kitchen Lack of Transportation (Non-Medical):   Physical Activity:   . Days of Exercise per Week:   . Minutes of Exercise per Session:   Stress:   . Feeling of Stress :   Social Connections:   . Frequency of Communication with Friends and Family:   . Frequency of Social Gatherings with Friends and Family:   . Attends Religious Services:   . Active Member of Clubs or Organizations:   . Attends Archivist Meetings:   Marland Kitchen Marital Status:   Intimate Partner Violence:   . Fear of Current or Ex-Partner:   . Emotionally Abused:   Marland Kitchen  Physically Abused:   . Sexually Abused:     Family History  Problem Relation Age of Onset  . Hypotension Mother   . Heart attack Father     ROS- All systems are reviewed and negative except as per the HPI above  Physical Exam: Vitals:   08/18/19 1013  BP: (!) 150/86  Pulse: (!) 125  Weight: 101.9 kg  Height: 6\' 4"  (1.93 m)   Wt Readings from Last 3 Encounters:  08/18/19 101.9 kg  06/26/19 108.9 kg  06/15/19 108.9 kg    Labs: Lab Results  Component Value Date   NA 142 06/09/2019   K 3.9 06/09/2019   CL 110 (H) 06/09/2019   CO2 21 06/09/2019   GLUCOSE 79 06/09/2019   BUN 24 06/09/2019   CREATININE 1.23 06/09/2019   CALCIUM 9.2 06/09/2019   No results found for: INR No results found for: CHOL, HDL, LDLCALC, TRIG   GEN- The patient is well appearing, alert and oriented x 3 today.   Head- normocephalic, atraumatic Eyes-  Sclera clear, conjunctiva pink Ears- hearing intact Oropharynx- clear Neck- supple, no JVP Lymph- no cervical lymphadenopathy Lungs- Clear to ausculation bilaterally, normal work of breathing Heart- Rapid regular rate and rhythm, no murmurs, rubs or gallops, PMI not laterally displaced GI- soft, NT, ND, + BS Extremities- no clubbing, cyanosis, or edema MS- no significant deformity or atrophy Skin- no rash or lesion Psych- euthymic mood, full affect Neuro- strength and sensation are intact  EKG-afib at 125 bpm, qrs int 82 bpm, qtc 470 ms  Echo-1. Left ventricular ejection fraction, by visual estimation, is 50 to  55%. The left ventricle has low normal function. There is no left  ventricular hypertrophy. GLS -18.4%.  2. Left ventricular diastolic parameters are consistent with Grade I  diastolic dysfunction (impaired relaxation).  3. Mildly dilated left ventricular internal cavity size.  4. The left ventricle has no regional wall motion abnormalities.  5. Global right ventricle has normal systolic function.The right  ventricular size  is normal. No increase in right ventricular wall  thickness.  6. Left atrial size was normal.  7. Right atrial size was normal.  8. The mitral valve is normal in structure. Trivial mitral valve  regurgitation. No evidence of mitral stenosis.  9. The tricuspid valve is normal in structure.  10. The aortic valve is tricuspid. Aortic valve regurgitation is not  visualized. No evidence of aortic valve sclerosis or stenosis.  11. There is mild dilatation of the ascending aorta measuring 40 mm.  12. The tricuspid regurgitant velocity is 2.09 m/s, and with an assumed  right atrial pressure of 3 mmHg, the estimated right ventricular systolic  pressure is  normal at 20.5 mmHg.  13. The inferior vena cava is normal in size with greater than 50%  respiratory variability, suggesting right atrial pressure of 3 mmHg.    Assessment and Plan: 1. Paroxysmal afib   In afib today with RVR and unaware Discussed with pt starting flecainide, per Dr. Jackalyn Lombard plan,  to encourage more regular SR He is willing to try this He will start next week and I will see back on Friday for f/u EKG  He denies any issues  with exertional chest pain, no known CAD No conduction issues on last  ekg in SR  2. Lifestyle issues  Encouraged to cut down on beer use, ideally no more than 2 servings a week  Encouraged to go ahead and complete home study and treat sleep apnea if present  Regular exercise encouraged  I have enocuraged a means to track afib since he is not aware when he is in it.   I will see back next week for EKG after flecainide start    Pen Mar. Megon Kalina, [facility] [address] [place], [place] [address] [phone]

## 2019-08-18 NOTE — Patient Instructions (Addendum)
Start Flecainide 50mg  twice a day on Wednesday. April 21st

## 2019-08-23 ENCOUNTER — Encounter (INDEPENDENT_AMBULATORY_CARE_PROVIDER_SITE_OTHER): Payer: 59 | Admitting: Cardiology

## 2019-08-23 DIAGNOSIS — G4733 Obstructive sleep apnea (adult) (pediatric): Secondary | ICD-10-CM | POA: Diagnosis not present

## 2019-08-25 ENCOUNTER — Other Ambulatory Visit: Payer: Self-pay

## 2019-08-25 ENCOUNTER — Ambulatory Visit (HOSPITAL_COMMUNITY)
Admission: RE | Admit: 2019-08-25 | Discharge: 2019-08-25 | Disposition: A | Discharge status: Home / Self Care | Payer: 59 | Source: Ambulatory Visit | Attending: Nurse Practitioner | Admitting: Nurse Practitioner

## 2019-08-25 VITALS — BP 140/86 | HR 118

## 2019-08-25 DIAGNOSIS — I4891 Unspecified atrial fibrillation: Secondary | ICD-10-CM | POA: Diagnosis present

## 2019-08-25 DIAGNOSIS — I480 Paroxysmal atrial fibrillation: Secondary | ICD-10-CM

## 2019-08-25 NOTE — Progress Notes (Signed)
Pt in for ekg after starting flecainde 50 mg bid last Wednesday. However, he misunderstood and has been taking only once a day in the am. He will go to 50 mg bid and I will see back for EKG on Monday 4/26.BP 140/86. He remains in afib at 118 bpm, qrs int 84 ms, qtc 473 bpm

## 2019-08-28 ENCOUNTER — Ambulatory Visit (HOSPITAL_COMMUNITY)
Admission: RE | Admit: 2019-08-28 | Discharge: 2019-08-28 | Disposition: A | Discharge status: Home / Self Care | Payer: 59 | Source: Ambulatory Visit | Attending: Nurse Practitioner | Admitting: Nurse Practitioner

## 2019-08-28 ENCOUNTER — Other Ambulatory Visit: Payer: Self-pay

## 2019-08-28 ENCOUNTER — Encounter (HOSPITAL_COMMUNITY): Payer: Self-pay | Admitting: Nurse Practitioner

## 2019-08-28 VITALS — BP 160/100 | HR 120

## 2019-08-28 DIAGNOSIS — I4819 Other persistent atrial fibrillation: Secondary | ICD-10-CM

## 2019-08-28 DIAGNOSIS — Z7901 Long term (current) use of anticoagulants: Secondary | ICD-10-CM | POA: Diagnosis not present

## 2019-08-28 DIAGNOSIS — I480 Paroxysmal atrial fibrillation: Secondary | ICD-10-CM | POA: Insufficient documentation

## 2019-08-28 DIAGNOSIS — Z87891 Personal history of nicotine dependence: Secondary | ICD-10-CM | POA: Insufficient documentation

## 2019-08-28 DIAGNOSIS — Z8249 Family history of ischemic heart disease and other diseases of the circulatory system: Secondary | ICD-10-CM | POA: Insufficient documentation

## 2019-08-28 DIAGNOSIS — Z9221 Personal history of antineoplastic chemotherapy: Secondary | ICD-10-CM | POA: Diagnosis not present

## 2019-08-28 DIAGNOSIS — D6869 Other thrombophilia: Secondary | ICD-10-CM | POA: Diagnosis not present

## 2019-08-28 DIAGNOSIS — Z880 Allergy status to penicillin: Secondary | ICD-10-CM | POA: Insufficient documentation

## 2019-08-28 DIAGNOSIS — R0683 Snoring: Secondary | ICD-10-CM | POA: Insufficient documentation

## 2019-08-28 DIAGNOSIS — Z79899 Other long term (current) drug therapy: Secondary | ICD-10-CM | POA: Insufficient documentation

## 2019-08-28 DIAGNOSIS — Z85038 Personal history of other malignant neoplasm of large intestine: Secondary | ICD-10-CM | POA: Diagnosis not present

## 2019-08-28 MED ORDER — DILTIAZEM HCL ER COATED BEADS 120 MG PO CP24: 120.0000 mg | ORAL_CAPSULE | Freq: Two times a day (BID) | ORAL | 3 refills | Status: DC

## 2019-08-28 MED ORDER — RIVAROXABAN 20 MG PO TABS: 20.0000 mg | ORAL_TABLET | Freq: Every day | ORAL | 3 refills | Status: DC

## 2019-08-28 MED ORDER — FLECAINIDE ACETATE 50 MG PO TABS: 100.0000 mg | ORAL_TABLET | Freq: Two times a day (BID) | ORAL | 3 refills | Status: DC

## 2019-08-28 MED ORDER — FLECAINIDE ACETATE 50 MG PO TABS: 50.0000 mg | ORAL_TABLET | Freq: Two times a day (BID) | ORAL | 3 refills | Status: DC

## 2019-08-28 NOTE — Patient Instructions (Addendum)
Start Xarelto 20mg  once a day with supper  Increase Cardizem to 120mg  twice a day

## 2019-08-28 NOTE — Progress Notes (Addendum)
Primary Care Physician: Garth Bigness (Inactive) Referring Physician: Dr. Lamount Cohen Cardiologist: Dr. Kavin Leech Kubick is a 44 y.o. male with a h/o paroxymal afib that is here f/u from consult with Dr.Allred 06/26/19, for 33 % burden on monitor,with very elevated V rates and a regular tachycardia at 280 bpm.  He recommended ablation or flecainide if he tolerated the diltiazem that pt was placed on that visit. Pt did not want ablation at the time.   Today, he is in afib with v rate mid 120's. He is unaware. He has tolerated the diltiazem.  He obtained the home sleep study but he has not done this yet. Wife states witnessed snoring and apnea. He is not drinking excessive caffeine, but does drink 6+ beers a week maybe more on the weekend. Dr. Rayann Heman recommended getting a apple watch, KardiaMobile to track degree of afib, but he did not feel compelled to purchase. CHA2DS2VASc sore of 0. It was discussed to start flecainide 50 mg bid.   P\t is now back in afib clinc, 4/26, and despite staring on flecainide 50 mg bid he persists in afib. I fear that he has progressed from paroxysmal tp persistent, as he has been in afib for 3 visits now. I will start on xarelto 20 mg qd as I may have to cardiovert and will not increase flecainide to 100 mg bid until I get on anticoagulation. No bleeding history. BP is also increased and his HR is around 120 mg bpm.   Today, he denies symptoms of palpitations, chest pain, shortness of breath, orthopnea, PND, lower extremity edema, dizziness, presyncope, syncope, or neurologic sequela. The patient is tolerating medications without difficulties and is otherwise without complaint today.   Past Medical History:  Diagnosis Date  . Atrial fibrillation ([place]) 05/2019  . Colorectal cancer ([place]) 2001   treated with colon resection and permanent coloscopy.  He also had radiation and chemotherapy   Past Surgical History:  Procedure Laterality Date  . ABDOMINAL  PERINEAL BOWEL RESECTION    . PORTACATH PLACEMENT  2001    Current Outpatient Medications  Medication Sig Dispense Refill  . amphetamine-dextroamphetamine (ADDERALL XR) 25 MG 24 hr capsule Take 25 mg by mouth every morning.    . Ascorbic Acid (VITAMIN C) 1000 MG tablet Take 1,000 mg by mouth daily.    Marland Kitchen diltiazem (CARDIZEM CD) 120 MG 24 hr capsule Take 1 capsule (120 mg total) by mouth in the morning and at bedtime. 90 capsule 3  . flecainide (TAMBOCOR) 50 MG tablet Take 1 tablet (50 mg total) by mouth 2 (two) times daily. 60 tablet 3  . Multiple Vitamin (MULTIVITAMIN PO) Take 1 tablet by mouth daily.     . rivaroxaban (XARELTO) 20 MG TABS tablet Take 1 tablet (20 mg total) by mouth daily with supper. 30 tablet 3  . sertraline (ZOLOFT) 50 MG tablet Take 50 mg by mouth daily.    . sildenafil (VIAGRA) 100 MG tablet Take 50-100 mg by mouth daily as needed for erectile dysfunction.     No current facility-administered medications for this encounter.    Allergies  Allergen Reactions  . Penicillins Rash    rash    Social History   Socioeconomic History  . Marital status: Married    Spouse name: Not on file  . Number of children: Not on file  . Years of education: Not on file  . Highest education level: Not on file  Occupational History  . Not  on file  Tobacco Use  . Smoking status: Former Research scientist (life sciences)  . Smokeless tobacco: Never Used  Substance and Sexual Activity  . Alcohol use: Yes    Comment: none during the weekn, 6-12 beers on the weekend  . Drug use: Never  . Sexual activity: Not on file  Other Topics Concern  . 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 Difficulty of Paying Living Expenses:   Food Insecurity:   . Worried About [employer] fundraiser in the Last Year:   . Arboriculturist in  the Last Year:   Transportation Needs:   . Film/video editor (Medical):   Marland Kitchen Lack of Transportation (Non-Medical):   Physical Activity:   . Days of Exercise per Week:   . Minutes of Exercise per Session:   Stress:   . Feeling of Stress :   Social Connections:   . Frequency of Communication with Friends and Family:   . Frequency of Social Gatherings with Friends and Family:   . Attends Religious Services:   . Active Member of Clubs or Organizations:   . Attends Archivist Meetings:   Marland Kitchen Marital Status:   Intimate Partner Violence:   . Fear of Current or Ex-Partner:   . Emotionally Abused:   Marland Kitchen Physically Abused:   . Sexually Abused:     Family History  Problem Relation Age of Onset  . Hypotension Mother   . Heart attack Father     ROS- All systems are reviewed and negative except as per the HPI above  Physical Exam: Vitals:   08/28/19 1543  BP: (!) 160/100  Pulse: (!) 120   Wt Readings from Last 3 Encounters:  08/18/19 101.9 kg  06/26/19 108.9 kg  06/15/19 108.9 kg    Labs: Lab Results  Component Value Date   NA 142 06/09/2019   K 3.9 06/09/2019   CL 110 (H) 06/09/2019   CO2 21 06/09/2019   GLUCOSE 79 06/09/2019   BUN 24 06/09/2019   CREATININE 1.23 06/09/2019   CALCIUM 9.2 06/09/2019   No results found for: INR No results found for: CHOL, HDL, LDLCALC, TRIG   GEN- The patient is well appearing, alert and oriented x 3 today.   Head- normocephalic, atraumatic Eyes-  Sclera clear, conjunctiva pink Ears- hearing intact Oropharynx- clear Neck- supple, no JVP Lymph- no cervical lymphadenopathy Lungs- Clear to ausculation bilaterally, normal work of breathing Heart- Rapid regular rate and rhythm, no murmurs, rubs or gallops, PMI not laterally displaced GI- soft, NT, ND, + BS Extremities- no clubbing, cyanosis, or edema MS- no significant deformity or atrophy Skin- no rash or lesion Psych- euthymic mood, full affect Neuro- strength and  sensation are intact  EKG-afib at 125 bpm, qrs int 82 bpm, qtc 470 ms  Echo-1. Left ventricular ejection fraction, by visual estimation, is 50 to  55%. The left ventricle has low normal function. There is no left  ventricular hypertrophy. GLS -18.4%.  2. Left ventricular diastolic parameters are consistent with Grade I  diastolic dysfunction (impaired relaxation).  3. Mildly dilated left ventricular internal cavity size.  4. The left ventricle has no regional wall motion abnormalities.  5. Global right ventricle has normal systolic function.The right  ventricular size  is normal. No increase in right ventricular wall  thickness.  6. Left atrial size was normal.  7. Right atrial size was normal.  8. The mitral valve is normal in structure. Trivial mitral valve  regurgitation. No evidence of mitral stenosis.  9. The tricuspid valve is normal in structure.  10. The aortic valve is tricuspid. Aortic valve regurgitation is not  visualized. No evidence of aortic valve sclerosis or stenosis.  11. There is mild dilatation of the ascending aorta measuring 40 mm.  12. The tricuspid regurgitant velocity is 2.09 m/s, and with an assumed  right atrial pressure of 3 mmHg, the estimated right ventricular systolic  pressure is normal at 20.5 mmHg.  13. The inferior vena cava is normal in size with greater than 50%  respiratory variability, suggesting right atrial pressure of 3 mmHg.    Assessment and Plan: 1. Paroxysmal afib   In afib today with RVR and unaware I am afraid that he has progressed from paroxysmal and   is now persistent as this is all I have seen over the last week with 3 ekg's   Will start on anticoagulation as I anticipate possible cardioversion. CHA2DS2VASc score of 0 I will hold flecainide at 50 mg bid for now and will wait to increase to 100 mg after I get him on anticoagulation I will start xarelto 20 mg daily, starting tonight  As he has had v rates in the 120's, and  elevated BP at 160/100 today,  I will increase diltiazem to 120 mg bid  I am aware that he did have some brady at hs on monitor when he was paroxysmal  I have sent him out with a BP cuff so he can monitor HR/BP at home   2. Lifestyle issues  Encouraged to cut down on beer use, ideally no more than 2 servings a week  Encouraged to go ahead and complete home study and treat sleep apnea if present  Regular exercise encouraged  I have enocuraged a means to track afib since he is not aware when he is in it.   I will see back  on Friday and will discuss TEE guided cardioversion or waiting 3 weeks for full anticoagulation, if he remains in [place]. Flint Hakeem, [facility] [address] [place], [place] [address] [phone]

## 2019-09-01 ENCOUNTER — Ambulatory Visit (HOSPITAL_COMMUNITY)
Admission: RE | Admit: 2019-09-01 | Discharge: 2019-09-01 | Disposition: A | Discharge status: Home / Self Care | Payer: 59 | Source: Ambulatory Visit | Attending: Nurse Practitioner | Admitting: Nurse Practitioner

## 2019-09-01 ENCOUNTER — Other Ambulatory Visit: Payer: Self-pay

## 2019-09-01 DIAGNOSIS — I4891 Unspecified atrial fibrillation: Secondary | ICD-10-CM | POA: Diagnosis present

## 2019-09-01 DIAGNOSIS — Z7901 Long term (current) use of anticoagulants: Secondary | ICD-10-CM | POA: Diagnosis not present

## 2019-09-01 DIAGNOSIS — Z79899 Other long term (current) drug therapy: Secondary | ICD-10-CM | POA: Diagnosis not present

## 2019-09-01 DIAGNOSIS — R9431 Abnormal electrocardiogram [ECG] [EKG]: Secondary | ICD-10-CM | POA: Diagnosis not present

## 2019-09-01 NOTE — Progress Notes (Signed)
Pt in for EKG since doubling cardizem to 240 mg . He is better rate controlled at 100 bpm, he feels better. He is loading on xarelto 20 mg daily. Will continue flecainide 50 mg bid but will not increase to 100 mg bid until he is full anticoagulated as he has become persistent  and then will plan for cardioversion. He will return in 2 weeks. Qrs todat at 88 ms, qtc at 456 ms.

## 2019-09-06 ENCOUNTER — Ambulatory Visit: Payer: 59

## 2019-09-08 ENCOUNTER — Other Ambulatory Visit: Payer: Self-pay

## 2019-09-12 ENCOUNTER — Other Ambulatory Visit (HOSPITAL_COMMUNITY): Payer: Self-pay

## 2019-09-12 MED ORDER — DILTIAZEM HCL ER COATED BEADS 120 MG PO CP24: 120.0000 mg | ORAL_CAPSULE | Freq: Two times a day (BID) | ORAL | 3 refills | Status: DC

## 2019-09-13 ENCOUNTER — Telehealth (HOSPITAL_COMMUNITY): Payer: Self-pay

## 2019-09-13 NOTE — Telephone Encounter (Signed)
Patient called on 5/11 regarding refills on his Diltiazem because he is completely out. Left message for patient on 5/11 to notify him I contacted his pharmacy [facility] about the change in his increased dose. Sent in medication with 3 refills.

## 2019-09-14 ENCOUNTER — Encounter (HOSPITAL_COMMUNITY): Payer: Self-pay | Admitting: Nurse Practitioner

## 2019-09-14 ENCOUNTER — Ambulatory Visit (HOSPITAL_COMMUNITY)
Admission: RE | Admit: 2019-09-14 | Discharge: 2019-09-14 | Disposition: A | Discharge status: Home / Self Care | Payer: 59 | Source: Ambulatory Visit | Attending: Physician Assistant | Admitting: Physician Assistant

## 2019-09-14 ENCOUNTER — Other Ambulatory Visit (HOSPITAL_COMMUNITY): Payer: Self-pay | Admitting: *Deleted

## 2019-09-14 ENCOUNTER — Other Ambulatory Visit: Payer: Self-pay

## 2019-09-14 VITALS — BP 140/70 | HR 105 | Ht 76.0 in | Wt 221.8 lb

## 2019-09-14 DIAGNOSIS — Z8249 Family history of ischemic heart disease and other diseases of the circulatory system: Secondary | ICD-10-CM | POA: Insufficient documentation

## 2019-09-14 DIAGNOSIS — Z87891 Personal history of nicotine dependence: Secondary | ICD-10-CM | POA: Insufficient documentation

## 2019-09-14 DIAGNOSIS — I4819 Other persistent atrial fibrillation: Secondary | ICD-10-CM | POA: Diagnosis present

## 2019-09-14 DIAGNOSIS — Z9221 Personal history of antineoplastic chemotherapy: Secondary | ICD-10-CM | POA: Diagnosis not present

## 2019-09-14 DIAGNOSIS — Z79899 Other long term (current) drug therapy: Secondary | ICD-10-CM | POA: Insufficient documentation

## 2019-09-14 DIAGNOSIS — Z85038 Personal history of other malignant neoplasm of large intestine: Secondary | ICD-10-CM | POA: Insufficient documentation

## 2019-09-14 DIAGNOSIS — Z923 Personal history of irradiation: Secondary | ICD-10-CM | POA: Diagnosis not present

## 2019-09-14 DIAGNOSIS — G4730 Sleep apnea, unspecified: Secondary | ICD-10-CM | POA: Insufficient documentation

## 2019-09-14 DIAGNOSIS — D6869 Other thrombophilia: Secondary | ICD-10-CM | POA: Diagnosis not present

## 2019-09-14 DIAGNOSIS — Z7901 Long term (current) use of anticoagulants: Secondary | ICD-10-CM | POA: Insufficient documentation

## 2019-09-14 DIAGNOSIS — Z880 Allergy status to penicillin: Secondary | ICD-10-CM | POA: Diagnosis not present

## 2019-09-14 LAB — CBC
HCT: 46 % (ref 39.0–52.0)
Hemoglobin: 14.7 g/dL (ref 13.0–17.0)
MCH: 30.8 pg (ref 26.0–34.0)
MCHC: 32 g/dL (ref 30.0–36.0)
MCV: 96.4 fL (ref 80.0–100.0)
Platelets: 182 10*3/uL (ref 150–400)
RBC: 4.77 MIL/uL (ref 4.22–5.81)
RDW: 13.2 % (ref 11.5–15.5)
WBC: 5.3 10*3/uL (ref 4.0–10.5)
nRBC: 0 % (ref 0.0–0.2)

## 2019-09-14 LAB — BASIC METABOLIC PANEL
Anion gap: 9 (ref 5–15)
BUN: 28 mg/dL — ABNORMAL HIGH (ref 6–20)
CO2: 25 mmol/L (ref 22–32)
Calcium: 9.2 mg/dL (ref 8.9–10.3)
Chloride: 107 mmol/L (ref 98–111)
Creatinine, Ser: 1.24 mg/dL (ref 0.61–1.24)
GFR calc Af Amer: >60 mL/min (ref >60)
GFR calc non Af Amer: >60 mL/min (ref >60)
Glucose, Bld: 89 mg/dL (ref 70–99)
Potassium: 4.4 mmol/L (ref 3.5–5.1)
Sodium: 141 mmol/L (ref 135–145)

## 2019-09-14 MED ORDER — FLECAINIDE ACETATE 50 MG PO TABS: 100.0000 mg | ORAL_TABLET | Freq: Two times a day (BID) | ORAL | 3 refills | Status: DC

## 2019-09-14 MED ORDER — FLECAINIDE ACETATE 100 MG PO TABS: 100.0000 mg | ORAL_TABLET | Freq: Two times a day (BID) | ORAL | 3 refills | Status: DC

## 2019-09-14 NOTE — Patient Instructions (Addendum)
Cardioversion scheduled for Friday, May 28th  - Arrive at the [facility] and go to admitting at [facility] not eat or drink anything after midnight the night prior to your procedure.  - Take all your morning medication with a sip of water prior to arrival.  - You will not be able to drive home after your procedure. Do NOT miss any doses of your Xarelto going forward -- if you should be notify our office  On May 25th you will increase your flecainide to 100mg  twice a day  On May 28th you will decrease your cardizem (diltiazem) to 120mg  once a day

## 2019-09-14 NOTE — Progress Notes (Signed)
Primary Care Physician: Garth Bigness (Inactive) Referring Physician: Dr. Lamount Cohen Cardiologist: Dr. Kavin Leech Cuadras is a 44 y.o. male with a h/o paroxymal afib that is here f/u from consult with Dr.Allred 06/26/19, for 33 % burden on monitor,with very elevated V rates and a regular tachycardia at 280 bpm.  He recommended ablation or flecainide if he tolerated the diltiazem that pt was placed on that visit. Pt did not want ablation at the time.   Today, he is in afib with v rate mid 120's. He is unaware. He has tolerated the diltiazem.  He obtained the home sleep study but he has not done this yet. Wife states witnessed snoring and apnea. He is not drinking excessive caffeine, but does drink 6+ beers a week maybe more on the weekend. Dr. Rayann Heman recommended getting a apple watch, KardiaMobile to track degree of afib, but he did not feel compelled to purchase. CHA2DS2VASc sore of 0. It was discussed to start flecainide 50 mg bid.   P\t is now back in afib clinc, 4/26, and despite staring on flecainide 50 mg bid he persists in afib. I fear that he has progressed from paroxysmal tp persistent, as he has been in afib for 3 visits now. I will start on xarelto 20 mg qd as I may have to cardiovert and will not increase flecainide to 100 mg bid until I get on anticoagulation. No bleeding history. BP is also increased and his HR is around 120 mg bpm.   F/u in afib clinic, 5/13. He will have been on anticoagulation 21 days on May 17th  and will be eligible for cardioversion after 5/17, he has not missed any anticoagulation since start. He recently had a sleep study, results are not in [place] yet but he was told he had severe sleep apnea. Received a call from [facility] today but was not able to answer. He feels this was probably  related to obtaining cpap. No issues with xarelto.   Today, he denies symptoms of palpitations, chest pain, shortness of breath, orthopnea, PND, lower extremity  edema, dizziness, presyncope, syncope, or neurologic sequela. The patient is tolerating medications without difficulties and is otherwise without complaint today.   Past Medical History:  Diagnosis Date  . Atrial fibrillation ([place]) 05/2019  . Colorectal cancer ([place]) 2001   treated with colon resection and permanent coloscopy.  He also had radiation and chemotherapy   Past Surgical History:  Procedure Laterality Date  . ABDOMINAL PERINEAL BOWEL RESECTION    . PORTACATH PLACEMENT  2001    Current Outpatient Medications  Medication Sig Dispense Refill  . amphetamine-dextroamphetamine (ADDERALL XR) 25 MG 24 hr capsule Take 25 mg by mouth every morning.    . Ascorbic Acid (VITAMIN C) 1000 MG tablet Take 1,000 mg by mouth daily.    Marland Kitchen diltiazem (CARDIZEM CD) 120 MG 24 hr capsule Take 1 capsule (120 mg total) by mouth in the morning and at bedtime. 60 capsule 3  . flecainide (TAMBOCOR) 50 MG tablet Take 1 tablet (50 mg total) by mouth 2 (two) times daily. 60 tablet 3  . Multiple Vitamin (MULTIVITAMIN PO) Take 1 tablet by mouth daily.     . rivaroxaban (XARELTO) 20 MG TABS tablet Take 1 tablet (20 mg total) by mouth daily with supper. 30 tablet 3  . sertraline (ZOLOFT) 50 MG tablet Take 50 mg by mouth daily.    . sildenafil (VIAGRA) 100 MG tablet Take 50-100 mg by mouth daily  as needed for erectile dysfunction.     No current facility-administered medications for this encounter.    Allergies  Allergen Reactions  . Penicillins Rash    rash    Social History   Socioeconomic History  . Marital status: Married    Spouse name: Not on file  . Number of children: Not on file  . Years of education: Not on file  . Highest education level: Not on file  Occupational History  . Not on file  Tobacco Use  . Smoking status: Former Research scientist (life sciences)  . Smokeless tobacco: Never Used  Substance and Sexual Activity  . Alcohol use: Yes    Comment: none during the weekn, 6-12 beers on the weekend  . Drug  use: Never  . Sexual activity: Not on file  Other Topics Concern  . 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 Difficulty of Paying Living Expenses:   Food Insecurity:   . Worried About Charity fundraiser in the Last Year:   . Arboriculturist in the Last Year:   Transportation Needs:   . Film/video editor (Medical):   Marland Kitchen Lack of Transportation (Non-Medical):   Physical Activity:   . Days of Exercise per Week:   . Minutes of Exercise per Session:   Stress:   . Feeling of Stress :   Social Connections:   . Frequency of Communication with Friends and Family:   . Frequency of Social Gatherings with Friends and Family:   . Attends Religious Services:   . Active Member of Clubs or Organizations:   . Attends Archivist Meetings:   Marland Kitchen Marital Status:   Intimate Partner Violence:   . Fear of Current or Ex-Partner:   . Emotionally Abused:   Marland Kitchen Physically Abused:   . Sexually Abused:     Family History  Problem Relation Age of Onset  . Hypotension Mother   . Heart attack Father     ROS- All systems are reviewed and negative except as per the HPI above  Physical Exam: There were no vitals filed for this visit. Wt Readings from Last 3 Encounters:  09/01/19 222 lb (100.7 kg)  08/18/19 224 lb 9.6 oz (101.9 kg)  06/26/19 240 lb (108.9 kg)    Labs: Lab Results  Component Value Date   NA 142 06/09/2019   K 3.9 06/09/2019   CL 110 (H) 06/09/2019   CO2 21 06/09/2019   GLUCOSE 79 06/09/2019   BUN 24 06/09/2019   CREATININE 1.23 06/09/2019   CALCIUM 9.2 06/09/2019   No results found for: INR No results found for: CHOL, HDL, LDLCALC, TRIG   GEN- The patient is well appearing, alert and oriented x 3 today.   Head- normocephalic, atraumatic Eyes-  Sclera clear, conjunctiva  pink Ears- hearing intact Oropharynx- clear Neck- supple, no JVP Lymph- no cervical lymphadenopathy Lungs- Clear to ausculation bilaterally, normal work of breathing Heart- irregular rate and rhythm, no murmurs, rubs or gallops, PMI not laterally displaced GI- soft, NT, ND, + BS Extremities- no clubbing, cyanosis, or edema MS- no significant deformity or atrophy Skin- no rash or lesion Psych- euthymic mood, full affect Neuro- strength and sensation are intact  EKG-afib at 125 bpm, qrs int 82  bpm, qtc 470 ms  Echo-1. Left ventricular ejection fraction, by visual estimation, is 50 to  55%. The left ventricle has low normal function. There is no left  ventricular hypertrophy. GLS -18.4%.  2. Left ventricular diastolic parameters are consistent with Grade I  diastolic dysfunction (impaired relaxation).  3. Mildly dilated left ventricular internal cavity size.  4. The left ventricle has no regional wall motion abnormalities.  5. Global right ventricle has normal systolic function.The right  ventricular size is normal. No increase in right ventricular wall  thickness.  6. Left atrial size was normal.  7. Right atrial size was normal.  8. The mitral valve is normal in structure. Trivial mitral valve  regurgitation. No evidence of mitral stenosis.  9. The tricuspid valve is normal in structure.  10. The aortic valve is tricuspid. Aortic valve regurgitation is not  visualized. No evidence of aortic valve sclerosis or stenosis.  11. There is mild dilatation of the ascending aorta measuring 40 mm.  12. The tricuspid regurgitant velocity is 2.09 m/s, and with an assumed  right atrial pressure of 3 mmHg, the estimated right ventricular systolic  pressure is normal at 20.5 mmHg.  13. The inferior vena cava is normal in size with greater than 50%  respiratory variability, suggesting right atrial pressure of 3 mmHg.    Assessment and Plan: 1. Persistent  afib   Remains in afib  better rate controlled with increase in cardizem to 120 mg bid   CHA2DS2VASc score of 0 Xarelto 20 mg qd started 4/24, no missed doses since start of drug I will hold flecainide at 50 mg bid for now and will  increase to 100 mg bid 3 days prior to cardioversion It is scheduled for 5/28 as he wants to wait until a Friday to have it done   Reduce  cardizem to 120 mg daily on am of cardioversion Bmet/cbc today   2. Lifestyle issues  Encouraged to cut down on beer use, ideally no more than 2 servings a week  Regular exercise encouraged  I have enocuraged a means to track afib since he is not aware when he is in it.   3. Severe sleep apnea Was told positive sleep study Pending start of cpap Encouraged to get on cpap as soon as possible to help be able to maintain SR  F/u here in one week after cardioverion   Butch Penny C. Cruz Bong, [facility] [address] [place], [place] [address] [phone]

## 2019-09-14 NOTE — H&P (View-Only) (Signed)
Primary Care Physician: Garth Bigness (Inactive) Referring Physician: Dr. Lamount Cohen Cardiologist: Dr. Kavin Leech Dylan Bennett is a 44 y.o. male with a h/o paroxymal afib that is here f/u from consult with Dr.Allred 06/26/19, for 33 % burden on monitor,with very elevated V rates and a regular tachycardia at 280 bpm.  He recommended ablation or flecainide if he tolerated the diltiazem that pt was placed on that visit. Pt did not want ablation at the time.   Today, he is in afib with v rate mid 120's. He is unaware. He has tolerated the diltiazem.  He obtained the home sleep study but he has not done this yet. Wife states witnessed snoring and apnea. He is not drinking excessive caffeine, but does drink 6+ beers a week maybe more on the weekend. Dr. Rayann Heman recommended getting a apple watch, KardiaMobile to track degree of afib, but he did not feel compelled to purchase. CHA2DS2VASc sore of 0. It was discussed to start flecainide 50 mg bid.   P\t is now back in afib clinc, 4/26, and despite staring on flecainide 50 mg bid he persists in afib. I fear that he has progressed from paroxysmal tp persistent, as he has been in afib for 3 visits now. I will start on xarelto 20 mg qd as I may have to cardiovert and will not increase flecainide to 100 mg bid until I get on anticoagulation. No bleeding history. BP is also increased and his HR is around 120 mg bpm.   F/u in afib clinic, 5/13. He will have been on anticoagulation 21 days on May 17th  and will be eligible for cardioversion after 5/17, he has not missed any anticoagulation since start. He recently had a sleep study, results are not in [place] yet but he was told he had severe sleep apnea. Received a call from [facility] today but was not able to answer. He feels this was probably  related to obtaining cpap. No issues with xarelto.   Today, he denies symptoms of palpitations, chest pain, shortness of breath, orthopnea, PND, lower extremity  edema, dizziness, presyncope, syncope, or neurologic sequela. The patient is tolerating medications without difficulties and is otherwise without complaint today.   Past Medical History:  Diagnosis Date  . Atrial fibrillation ([place]) 05/2019  . Colorectal cancer ([place]) 2001   treated with colon resection and permanent coloscopy.  He also had radiation and chemotherapy   Past Surgical History:  Procedure Laterality Date  . ABDOMINAL PERINEAL BOWEL RESECTION    . PORTACATH PLACEMENT  2001    Current Outpatient Medications  Medication Sig Dispense Refill  . amphetamine-dextroamphetamine (ADDERALL XR) 25 MG 24 hr capsule Take 25 mg by mouth every morning.    . Ascorbic Acid (VITAMIN C) 1000 MG tablet Take 1,000 mg by mouth daily.    Marland Kitchen diltiazem (CARDIZEM CD) 120 MG 24 hr capsule Take 1 capsule (120 mg total) by mouth in the morning and at bedtime. 60 capsule 3  . flecainide (TAMBOCOR) 50 MG tablet Take 1 tablet (50 mg total) by mouth 2 (two) times daily. 60 tablet 3  . Multiple Vitamin (MULTIVITAMIN PO) Take 1 tablet by mouth daily.     . rivaroxaban (XARELTO) 20 MG TABS tablet Take 1 tablet (20 mg total) by mouth daily with supper. 30 tablet 3  . sertraline (ZOLOFT) 50 MG tablet Take 50 mg by mouth daily.    . sildenafil (VIAGRA) 100 MG tablet Take 50-100 mg by mouth daily  as needed for erectile dysfunction.     No current facility-administered medications for this encounter.    Allergies  Allergen Reactions  . Penicillins Rash    rash    Social History   Socioeconomic History  . Marital status: Married    Spouse name: Not on file  . Number of children: Not on file  . Years of education: Not on file  . Highest education level: Not on file  Occupational History  . Not on file  Tobacco Use  . Smoking status: Former Research scientist (life sciences)  . Smokeless tobacco: Never Used  Substance and Sexual Activity  . Alcohol use: Yes    Comment: none during the weekn, 6-12 beers on the weekend  . Drug  use: Never  . Sexual activity: Not on file  Other Topics Concern  . 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 Difficulty of Paying Living Expenses:   Food Insecurity:   . Worried About Charity fundraiser in the Last Year:   . Arboriculturist in the Last Year:   Transportation Needs:   . Film/video editor (Medical):   Marland Kitchen Lack of Transportation (Non-Medical):   Physical Activity:   . Days of Exercise per Week:   . Minutes of Exercise per Session:   Stress:   . Feeling of Stress :   Social Connections:   . Frequency of Communication with Friends and Family:   . Frequency of Social Gatherings with Friends and Family:   . Attends Religious Services:   . Active Member of Clubs or Organizations:   . Attends Archivist Meetings:   Marland Kitchen Marital Status:   Intimate Partner Violence:   . Fear of Current or Ex-Partner:   . Emotionally Abused:   Marland Kitchen Physically Abused:   . Sexually Abused:     Family History  Problem Relation Age of Onset  . Hypotension Mother   . Heart attack Father     ROS- All systems are reviewed and negative except as per the HPI above  Physical Exam: There were no vitals filed for this visit. Wt Readings from Last 3 Encounters:  09/01/19 222 lb (100.7 kg)  08/18/19 224 lb 9.6 oz (101.9 kg)  06/26/19 240 lb (108.9 kg)    Labs: Lab Results  Component Value Date   NA 142 06/09/2019   K 3.9 06/09/2019   CL 110 (H) 06/09/2019   CO2 21 06/09/2019   GLUCOSE 79 06/09/2019   BUN 24 06/09/2019   CREATININE 1.23 06/09/2019   CALCIUM 9.2 06/09/2019   No results found for: INR No results found for: CHOL, HDL, LDLCALC, TRIG   GEN- The patient is well appearing, alert and oriented x 3 today.   Head- normocephalic, atraumatic Eyes-  Sclera clear, conjunctiva  pink Ears- hearing intact Oropharynx- clear Neck- supple, no JVP Lymph- no cervical lymphadenopathy Lungs- Clear to ausculation bilaterally, normal work of breathing Heart- irregular rate and rhythm, no murmurs, rubs or gallops, PMI not laterally displaced GI- soft, NT, ND, + BS Extremities- no clubbing, cyanosis, or edema MS- no significant deformity or atrophy Skin- no rash or lesion Psych- euthymic mood, full affect Neuro- strength and sensation are intact  EKG-afib at 125 bpm, qrs int 82  bpm, qtc 470 ms  Echo-1. Left ventricular ejection fraction, by visual estimation, is 50 to  55%. The left ventricle has low normal function. There is no left  ventricular hypertrophy. GLS -18.4%.  2. Left ventricular diastolic parameters are consistent with Grade I  diastolic dysfunction (impaired relaxation).  3. Mildly dilated left ventricular internal cavity size.  4. The left ventricle has no regional wall motion abnormalities.  5. Global right ventricle has normal systolic function.The right  ventricular size is normal. No increase in right ventricular wall  thickness.  6. Left atrial size was normal.  7. Right atrial size was normal.  8. The mitral valve is normal in structure. Trivial mitral valve  regurgitation. No evidence of mitral stenosis.  9. The tricuspid valve is normal in structure.  10. The aortic valve is tricuspid. Aortic valve regurgitation is not  visualized. No evidence of aortic valve sclerosis or stenosis.  11. There is mild dilatation of the ascending aorta measuring 40 mm.  12. The tricuspid regurgitant velocity is 2.09 m/s, and with an assumed  right atrial pressure of 3 mmHg, the estimated right ventricular systolic  pressure is normal at 20.5 mmHg.  13. The inferior vena cava is normal in size with greater than 50%  respiratory variability, suggesting right atrial pressure of 3 mmHg.    Assessment and Plan: 1. Persistent  afib   Remains in afib  better rate controlled with increase in cardizem to 120 mg bid   CHA2DS2VASc score of 0 Xarelto 20 mg qd started 4/24, no missed doses since start of drug I will hold flecainide at 50 mg bid for now and will  increase to 100 mg bid 3 days prior to cardioversion It is scheduled for 5/28 as he wants to wait until a Friday to have it done   Reduce  cardizem to 120 mg daily on am of cardioversion Bmet/cbc today   2. Lifestyle issues  Encouraged to cut down on beer use, ideally no more than 2 servings a week  Regular exercise encouraged  I have enocuraged a means to track afib since he is not aware when he is in it.   3. Severe sleep apnea Was told positive sleep study Pending start of cpap Encouraged to get on cpap as soon as possible to help be able to maintain SR  F/u here in one week after cardioverion   Butch Penny C. Fleta Borgeson, [facility] [address] [place], [place] [address] [phone]

## 2019-09-14 NOTE — Addendum Note (Signed)
Encounter addended by: Sherran Needs, NP on: 09/14/2019 2:35 PM  Actions taken: Clinical Note Signed

## 2019-09-15 ENCOUNTER — Ambulatory Visit (HOSPITAL_COMMUNITY): Payer: 59 | Admitting: Physician Assistant

## 2019-09-22 ENCOUNTER — Telehealth: Payer: Self-pay | Admitting: Internal Medicine

## 2019-09-22 NOTE — Telephone Encounter (Signed)
Dylan Bennett with [facility] is calling because this patient has an AHI greater than 100. She states that they need to change his pressure settings and needs to discuss with someone so he can get approval for therapy.

## 2019-09-25 NOTE — Procedures (Signed)
    Sleep Study Report  Patient Information Name: Dylan Bennett  ID: [id number] Birth Date: Feb 21, 1976  Age: 44  Gender: Male BMI: 41.0 (W=240 lb, H=5' 4'') Study Date:08/23/2019 Referring Physician:  [name], MD  TEST DESCRIPTION: Home sleep apnea testing was completed using the WatchPat, a Type 1 device, utilizing peripheral arterial tonometry (PAT), chest movement, actigraphy, pulse oximetry, pulse rate, body position and snore.AHI was calculated with apnea and hypopnea using valid sleep time as the denominator. RDI includes apneas, hypopneas, and RERAs. The data acquired and the scoring of sleep and all associated events were performed in accordance with the recommended standards and specifications as outlined in the AASM Manual for the Scoring of Sleep and Associated Events 2.2.0 (2015).  FINDINGS: 1. Severe Obstructive Sleep Apnea with AHI 101.4/hr.  2. No Central Sleep Apnea with pAHIc 0/hr. 3. Oxygen desaturations as low as 78%. 4. Severe snoring was present. O2 sats were < 88% for 51.4 min. 5. Total sleep time was 5 hrs and 30 min. 6. 8.2% of total sleep time was spent in REM sleep. 7. Prolonged sleep onset latency at 44 min.  8. Prolonged REM sleep onset latency at 105 min.  9. Total awakenings were 9.   DIAGNOSIS:  Severe Obstructive Sleep Apnea (G47.33)  RECOMMENDATIONS 1. Clinical correlation of these findings is necessary. The decision to treat obstructive sleep apnea (OSA) is usually based on the presence of apnea symptoms or the presence of associated medical conditions such as Hypertension, Congestive Heart Failure, Atrial Fibrillation or Obesity. The most common symptoms of OSA are snoring, gasping for breath while sleeping, daytime sleepiness and fatigue.   2. Initiating apnea therapy is recommended given the presence of symptoms and/or associated conditions.   Recommend proceeding with one of the following:  a. Auto-CPAP therapy with a pressure range of  5-20cm H2O.   b. An oral appliance (OA) that can be obtained from certain dentists with expertise in sleep medicine. These are primarily of use in non-obese patients with mild and moderate disease.   c. An ENT consultation which may be useful to look for specific causes of obstruction and possible treatment options.   d. If patient is intolerant to PAP therapy, consider referral to ENT for evaluation for hypoglossal nerve stimulator.   3. Close follow-up is necessary to ensure success with CPAP or oral appliance therapy for maximum benefit .  4. A follow-up oximetry study on CPAP is recommended to assess the adequacy of therapy and determine the need for supplemental oxygen or the potential need for Bi-level therapy. An arterial blood gas to determine the adequacy of baseline ventilation and oxygenation should also be considered.  5. Healthy sleep recommendations include: adequate nightly sleep (normal 7-9 hrs/night), avoidance of caffeine after noon and alcohol near bedtime, and maintaining a sleep environment that is cool, dark and quiet.  6. Weight loss for overweight patients is recommended. Even modest amounts of weight loss can significantly improve the severity of sleep apnea.  7. Snoring recommendations include: weight loss where appropriate, side sleeping, and avoidance of alcohol before bed.  8. Operation of motor vehicle or dangerous equipment must be avoided when feeling drowsy, excessively sleepy, or  mentally fatigued.  Report prepared by: Signature: Fransico Him, MD [facility] Electronically Signed: Sep 25, 2019

## 2019-09-27 ENCOUNTER — Telehealth: Payer: Self-pay | Admitting: *Deleted

## 2019-09-27 ENCOUNTER — Other Ambulatory Visit (HOSPITAL_COMMUNITY)
Admission: RE | Admit: 2019-09-27 | Discharge: 2019-09-27 | Disposition: A | Discharge status: Home / Self Care | Payer: 59 | Source: Ambulatory Visit | Attending: Cardiovascular Disease | Admitting: Cardiovascular Disease

## 2019-09-27 DIAGNOSIS — Z20822 Contact with and (suspected) exposure to covid-19: Secondary | ICD-10-CM | POA: Diagnosis not present

## 2019-09-27 DIAGNOSIS — G4733 Obstructive sleep apnea (adult) (pediatric): Secondary | ICD-10-CM

## 2019-09-27 DIAGNOSIS — Z01812 Encounter for preprocedural laboratory examination: Secondary | ICD-10-CM | POA: Diagnosis present

## 2019-09-27 NOTE — Telephone Encounter (Signed)
-----   Message from Freada Bergeron, [place] sent at 09/27/2019  5:46 PM EDT ----- Regarding: Precert Cpap titration ----- Message ----- From: Sueanne Margarita, MD Sent: 09/26/2019  10:22 AM EDT To: Freada Bergeron, CMA Subject: RE: SETTINGS PLEASE                            In lab  CPAP titration ----- Message ----- From: Freada Bergeron, CMA Sent: 09/26/2019   9:12 AM EDT To: Sueanne Margarita, MD Subject: SETTINGS PLEASE                                 Need settings for [facility] ST[facility] Auto-CPAP therapy with a pressure range of 5-20cm H2O?

## 2019-09-28 LAB — SARS CORONAVIRUS 2 (TAT 6-24 HRS): SARS Coronavirus 2: NEGATIVE

## 2019-09-29 ENCOUNTER — Ambulatory Visit (HOSPITAL_COMMUNITY): Payer: 59 | Admitting: Anesthesiology

## 2019-09-29 ENCOUNTER — Ambulatory Visit (HOSPITAL_COMMUNITY)
Admission: RE | Admit: 2019-09-29 | Discharge: 2019-09-29 | Disposition: A | Discharge status: Home / Self Care | Payer: 59 | Attending: Cardiovascular Disease | Admitting: Cardiovascular Disease

## 2019-09-29 ENCOUNTER — Encounter (HOSPITAL_COMMUNITY): Payer: Self-pay | Admitting: Cardiovascular Disease

## 2019-09-29 ENCOUNTER — Encounter (HOSPITAL_COMMUNITY)
Admission: RE | Disposition: A | Discharge status: Home / Self Care | Payer: Self-pay | Source: Home / Self Care | Attending: Cardiovascular Disease

## 2019-09-29 ENCOUNTER — Other Ambulatory Visit: Payer: Self-pay

## 2019-09-29 DIAGNOSIS — Z9221 Personal history of antineoplastic chemotherapy: Secondary | ICD-10-CM | POA: Insufficient documentation

## 2019-09-29 DIAGNOSIS — Z923 Personal history of irradiation: Secondary | ICD-10-CM | POA: Insufficient documentation

## 2019-09-29 DIAGNOSIS — G4730 Sleep apnea, unspecified: Secondary | ICD-10-CM | POA: Diagnosis not present

## 2019-09-29 DIAGNOSIS — Z85038 Personal history of other malignant neoplasm of large intestine: Secondary | ICD-10-CM | POA: Insufficient documentation

## 2019-09-29 DIAGNOSIS — I4891 Unspecified atrial fibrillation: Secondary | ICD-10-CM

## 2019-09-29 DIAGNOSIS — Z87891 Personal history of nicotine dependence: Secondary | ICD-10-CM | POA: Diagnosis not present

## 2019-09-29 DIAGNOSIS — Z79899 Other long term (current) drug therapy: Secondary | ICD-10-CM | POA: Diagnosis not present

## 2019-09-29 DIAGNOSIS — Z7901 Long term (current) use of anticoagulants: Secondary | ICD-10-CM | POA: Insufficient documentation

## 2019-09-29 DIAGNOSIS — I4819 Other persistent atrial fibrillation: Secondary | ICD-10-CM | POA: Insufficient documentation

## 2019-09-29 DIAGNOSIS — Z7289 Other problems related to lifestyle: Secondary | ICD-10-CM | POA: Insufficient documentation

## 2019-09-29 HISTORY — PX: CARDIOVERSION: SHX1299

## 2019-09-29 SURGERY — CARDIOVERSION
Anesthesia: General

## 2019-09-29 MED ORDER — PROPOFOL 10 MG/ML IV BOLUS
INTRAVENOUS | Status: DC | PRN
  Administered 2019-09-29: 200 mg via INTRAVENOUS

## 2019-09-29 MED ORDER — SODIUM CHLORIDE 0.9 % IV SOLN: INTRAVENOUS | Status: DC | PRN

## 2019-09-29 MED ORDER — LIDOCAINE 2% (20 MG/ML) 5 ML SYRINGE
INTRAMUSCULAR | Status: DC | PRN
  Administered 2019-09-29: 60 mg via INTRAVENOUS

## 2019-09-29 NOTE — Anesthesia Preprocedure Evaluation (Addendum)
Anesthesia Evaluation  Patient identified by MRN, date of birth, ID band Patient awake    Reviewed: Allergy & Precautions, NPO status , Patient's Chart, lab work & pertinent test results  Airway Mallampati: II  TM Distance: >3 FB Neck ROM: Full    Dental no notable dental hx.    Pulmonary former smoker,    Pulmonary exam normal breath sounds clear to auscultation       Cardiovascular + dysrhythmias Atrial Fibrillation  Rhythm:Irregular Rate:Normal     Neuro/Psych PSYCHIATRIC DISORDERS negative neurological ROS     GI/Hepatic Neg liver ROS, Colorectal cancer    Endo/Other  negative endocrine ROS  Renal/GU negative Renal ROS     Musculoskeletal negative musculoskeletal ROS (+)   Abdominal   Peds  Hematology negative hematology ROS (+)   Anesthesia Other Findings A-FIB  Reproductive/Obstetrics                            Anesthesia Physical Anesthesia Plan  ASA: III  Anesthesia Plan: General   Post-op Pain Management:    Induction: Intravenous  PONV Risk Score and Plan: 2 and Propofol infusion and Treatment may vary due to age or medical condition  Airway Management Planned: Mask  Additional Equipment:   Intra-op Plan:   Post-operative Plan:   Informed Consent: I have reviewed the patients History and Physical, chart, labs and discussed the procedure including the risks, benefits and alternatives for the proposed anesthesia with the patient or authorized representative who has indicated his/her understanding and acceptance.     Dental advisory given  Plan Discussed with: CRNA  Anesthesia Plan Comments:        Anesthesia Quick Evaluation

## 2019-09-29 NOTE — Discharge Instructions (Signed)
Electrical Cardioversion Electrical cardioversion is the delivery of a jolt of electricity to restore a normal rhythm to the heart. A rhythm that is too fast or is not regular keeps the heart from pumping well. In this procedure, sticky patches or metal paddles are placed on the chest to deliver electricity to the heart from a device. This procedure may be done in an emergency if:  There is low or no blood pressure as a result of the heart rhythm.  Normal rhythm must be restored as fast as possible to protect the brain and heart from further damage.  It may save a life. This may also be a scheduled procedure for irregular or fast heart rhythms that are not immediately life-threatening. Tell a health care provider about:  Any allergies you have.  All medicines you are taking, including vitamins, herbs, eye drops, creams, and over-the-counter medicines.  Any problems you or family members have had with anesthetic medicines.  Any blood disorders you have.  Any surgeries you have had.  Any medical conditions you have.  Whether you are pregnant or may be pregnant. What are the risks? Generally, this is a safe procedure. However, problems may occur, including:  Allergic reactions to medicines.  A blood clot that breaks free and travels to other parts of your body.  The possible return of an abnormal heart rhythm within hours or days after the procedure.  Your heart stopping (cardiac arrest). This is rare. What happens before the procedure? Medicines  Your health care provider may have you start taking: ? Blood-thinning medicines (anticoagulants) so your blood does not clot as easily. ? Medicines to help stabilize your heart rate and rhythm.  Ask your health care provider about: ? Changing or stopping your regular medicines. This is especially important if you are taking diabetes medicines or blood thinners. ? Taking medicines such as aspirin and ibuprofen. These medicines can  thin your blood. Do not take these medicines unless your health care provider tells you to take them. ? Taking over-the-counter medicines, vitamins, herbs, and supplements. General instructions  Follow instructions from your health care provider about eating or drinking restrictions.  Plan to have someone take you home from the hospital or clinic.  If you will be going home right after the procedure, plan to have someone with you for 24 hours.  Ask your health care provider what steps will be taken to help prevent infection. These may include washing your skin with a germ-killing soap. What happens during the procedure?   An IV will be inserted into one of your veins.  Sticky patches (electrodes) or metal paddles may be placed on your chest.  You will be given a medicine to help you relax (sedative).  An electrical shock will be delivered. The procedure may vary among health care providers and hospitals. What can I expect after the procedure?  Your blood pressure, heart rate, breathing rate, and blood oxygen level will be monitored until you leave the hospital or clinic.  Your heart rhythm will be watched to make sure it does not change.  You may have some redness on the skin where the shocks were given. Follow these instructions at home:  Do not drive for 24 hours if you were given a sedative during your procedure.  Take over-the-counter and prescription medicines only as told by your health care provider.  Ask your health care provider how to check your pulse. Check it often.  Rest for 48 hours after the procedure or   as told by your health care provider.  Avoid or limit your caffeine use as told by your health care provider.  Keep all follow-up visits as told by your health care provider. This is important. Contact a health care provider if:  You feel like your heart is beating too quickly or your pulse is not regular.  You have a serious muscle cramp that does not go  away. Get help right away if:  You have discomfort in your chest.  You are dizzy or you feel faint.  You have trouble breathing or you are short of breath.  Your speech is slurred.  You have trouble moving an arm or leg on one side of your body.  Your fingers or toes turn cold or blue. Summary  Electrical cardioversion is the delivery of a jolt of electricity to restore a normal rhythm to the heart.  This procedure may be done right away in an emergency or may be a scheduled procedure if the condition is not an emergency.  Generally, this is a safe procedure.  After the procedure, check your pulse often as told by your health care provider. This information is not intended to replace advice given to you by your health care provider. Make sure you discuss any questions you have with your health care provider. Document Revised: 11/21/2018 Document Reviewed: 11/21/2018 Elsevier Patient Education  2020 Elsevier Inc.  

## 2019-09-29 NOTE — Interval H&P Note (Signed)
History and Physical Interval Note:  09/29/2019 9:19 AM  Dylan Bennett  has presented today for surgery, with the diagnosis of AFIB.  The various methods of treatment have been discussed with the patient and family. After consideration of risks, benefits and other options for treatment, the patient has consented to  Procedure(s): CARDIOVERSION (N/A) as a surgical intervention.  The patient's history has been reviewed, patient examined, no change in status, stable for surgery.  I have reviewed the patient's chart and labs.  Questions were answered to the patient's satisfaction.     Mertie Moores

## 2019-09-29 NOTE — CV Procedure (Signed)
    Cardioversion Note  Merel Karaman JC:[id number] Jan 07, 1976  Procedure: DC Cardioversion Indications: atrial fib     Procedure Details Consent: Obtained Time Out: Verified patient identification, verified procedure, site/side was marked, verified correct patient position, special equipment/implants available, Radiology Safety Procedures followed,  medications/allergies/relevent history reviewed, required imaging and test results available.  Performed  The patient has been on adequate anticoagulation.  The patient received IV Lidocaine 60 mg IV followed by Propofol 200 mg IV  for sedation.  Synchronous cardioversion was performed at 200  joules.  The cardioversion was successful.     Complications: No apparent complications Patient did tolerate procedure well.   Thayer Headings, Brooke Bonito., MD, [facility] 09/29/2019, 9:39 AM

## 2019-09-29 NOTE — Transfer of Care (Signed)
Immediate Anesthesia Transfer of Care Note  Patient: Dylan Bennett  Procedure(s) Performed: CARDIOVERSION (N/A )  Patient Location: Endoscopy Unit  Anesthesia Type:General  Level of Consciousness: drowsy  Airway & Oxygen Therapy: Patient Spontanous Breathing and Patient connected to nasal cannula oxygen  Post-op Assessment: Report given to RN and Post -op Vital signs reviewed and stable  Post vital signs: Reviewed and stable  Last Vitals:  Vitals Value Taken Time  BP    Temp    Pulse    Resp    SpO2      Last Pain:  Vitals:   09/29/19 0922  TempSrc: Temporal  PainSc: 0-No pain         Complications: No apparent anesthesia complications

## 2019-09-30 NOTE — Anesthesia Postprocedure Evaluation (Signed)
Anesthesia Post Note  Patient: Dylan Bennett  Procedure(s) Performed: CARDIOVERSION (N/A )     Patient location during evaluation: Endoscopy Anesthesia Type: General Level of consciousness: awake and alert Pain management: pain level controlled Vital Signs Assessment: post-procedure vital signs reviewed and stable Respiratory status: spontaneous breathing, nonlabored ventilation, respiratory function stable and patient connected to nasal cannula oxygen Cardiovascular status: blood pressure returned to baseline and stable Postop Assessment: no apparent nausea or vomiting Anesthetic complications: no    Last Vitals:  Vitals:   09/29/19 0950 09/29/19 1000  BP: 132/90 (!) 129/99  Pulse: 79 74  Resp: 13 12  Temp:    SpO2: 99% 100%    Last Pain:  Vitals:   09/29/19 1000  TempSrc:   PainSc: 0-No pain                 Junia Nygren P Ermal Haberer

## 2019-10-05 ENCOUNTER — Telehealth: Payer: Self-pay | Admitting: *Deleted

## 2019-10-05 ENCOUNTER — Other Ambulatory Visit: Payer: Self-pay

## 2019-10-05 ENCOUNTER — Ambulatory Visit (HOSPITAL_COMMUNITY)
Admission: RE | Admit: 2019-10-05 | Discharge: 2019-10-05 | Disposition: A | Discharge status: Home / Self Care | Payer: 59 | Source: Ambulatory Visit | Attending: Nurse Practitioner | Admitting: Nurse Practitioner

## 2019-10-05 VITALS — BP 156/86 | HR 132 | Ht 76.0 in | Wt 224.0 lb

## 2019-10-05 DIAGNOSIS — I4891 Unspecified atrial fibrillation: Secondary | ICD-10-CM | POA: Diagnosis not present

## 2019-10-05 DIAGNOSIS — I4819 Other persistent atrial fibrillation: Secondary | ICD-10-CM | POA: Diagnosis present

## 2019-10-05 DIAGNOSIS — Z79899 Other long term (current) drug therapy: Secondary | ICD-10-CM | POA: Diagnosis not present

## 2019-10-05 DIAGNOSIS — R202 Paresthesia of skin: Secondary | ICD-10-CM | POA: Insufficient documentation

## 2019-10-05 DIAGNOSIS — Z87891 Personal history of nicotine dependence: Secondary | ICD-10-CM | POA: Insufficient documentation

## 2019-10-05 DIAGNOSIS — D6869 Other thrombophilia: Secondary | ICD-10-CM | POA: Diagnosis not present

## 2019-10-05 DIAGNOSIS — G4730 Sleep apnea, unspecified: Secondary | ICD-10-CM | POA: Insufficient documentation

## 2019-10-05 MED ORDER — DILTIAZEM HCL ER COATED BEADS 120 MG PO CP24: 120.0000 mg | ORAL_CAPSULE | Freq: Two times a day (BID) | ORAL | 3 refills | Status: DC

## 2019-10-05 NOTE — Telephone Encounter (Signed)
Informed patient of sleep study results and patient understanding was verbalized. Patient understands his sleep study showed they have sleep apnea and recommend CPAP titration. Please set up titration in the sleep lab.   Titration sent to sleep pool  

## 2019-10-05 NOTE — Telephone Encounter (Signed)
-----   Message from Sueanne Margarita, MD sent at 09/25/2019  9:26 PM EDT ----- Please let patient know that they have sleep apnea and recommend CPAP titration. Please set up titration in the sleep lab.

## 2019-10-05 NOTE — Patient Instructions (Signed)
Stop flecainide  Increase cardizem to 120mg  twice a day  Dr. Radford Pax for CPAP- [phone]  Scheduling will contact you regarding appointment with Dr. Rayann Heman

## 2019-10-06 ENCOUNTER — Encounter (HOSPITAL_COMMUNITY): Payer: Self-pay | Admitting: Nurse Practitioner

## 2019-10-06 NOTE — Progress Notes (Signed)
Primary Care Physician: Garth Bigness (Inactive) Referring Physician: Dr. Lamount Cohen Cardiologist: Dr. Kavin Leech Dylan Bennett is a 44 y.o. male with a h/o paroxymal afib that is here f/u from consult with Dr.Allred 06/26/19, for 33 % burden on monitor,with very elevated V rates and a regular tachycardia at 280 bpm.  He recommended ablation or flecainide if he tolerated the diltiazem that pt was placed on that visit. Pt did not want ablation at the time.   Today, he is in afib with v rate mid 120's. He is unaware. He has tolerated the diltiazem.  He obtained the home sleep study but he has not done this yet. Wife states witnessed snoring and apnea. He is not drinking excessive caffeine, but does drink 6+ beers a week maybe more on the weekend. Dr. Rayann Heman recommended getting a apple watch, [employer] to track degree of afib, but he did not feel compelled to purchase. CHA2DS2VASc sore of 0. It was discussed to start flecainide 50 mg bid.   P\t is now back in afib clinc, 4/26, and despite staring on flecainide 50 mg bid he persists in afib. I fear that he has progressed from paroxysmal tp persistent, as he has been in afib for 3 visits now. I will start on xarelto 20 mg qd as I may have to cardiovert and will not increase flecainide to 100 mg bid until I get on anticoagulation. No bleeding history. BP is also increased and his HR is around 120 mg bpm.   F/u in afib clinic, 5/13. He will have been on anticoagulation 21 days on May 17th  and will be eligible for cardioversion after 5/17, he has not missed any anticoagulation since start. He recently had a sleep study, results are not in [facility] yet but he was told he had severe sleep apnea. Received a call from [facility] today but was not able to answer. He feels this was probably  related to obtaining cpap. No issues with xarelto.   F/u in afib clinic, 6/3. Pt had successful cardioversion but unfortunately is back in afib with RVR. He did not  notice any difference in the way he felt after cardioversion.  He is unaware of the afib today. He is very upset that his afib is not responding to the treatment plan. He is also very upset that he has not been contacted to have cpap titration ordered for a positive  sleep study at which time someone called and said he had severesleep apnea but the final report is not in Epic. He also c/o of tingling in his left arm that was present since last fall, preceded the afib, that is there almost all the time,present now , not tied to activity,  that affects his left thumb thru middle finger and radiates to his inner arm up to his axilla. I have asked him to have this further evaluated with his PCP but he has not done this yet.   Today, he denies symptoms of palpitations, chest pain, shortness of breath, orthopnea, PND, lower extremity edema, dizziness, presyncope, syncope, or neurologic sequela. The patient is tolerating medications without difficulties and is otherwise without complaint today.   Past Medical History:  Diagnosis Date  . Atrial fibrillation ([place]) 05/2019  . Colorectal cancer ([place]) 2001   treated with colon resection and permanent coloscopy.  He also had radiation and chemotherapy   Past Surgical History:  Procedure Laterality Date  . ABDOMINAL PERINEAL BOWEL RESECTION    . CARDIOVERSION  N/A 09/29/2019   Procedure: CARDIOVERSION;  Surgeon: Acie Fredrickson Wonda Cheng, MD;  Location: [facility];  Service: Cardiovascular;  Laterality: N/A;  . PORTACATH PLACEMENT  2001    Current Outpatient Medications  Medication Sig Dispense Refill  . amphetamine-dextroamphetamine (ADDERALL XR) 25 MG 24 hr capsule Take 25 mg by mouth every morning.    . diltiazem (CARDIZEM CD) 120 MG 24 hr capsule Take 1 capsule (120 mg total) by mouth in the morning and at bedtime. 60 capsule 3  . rivaroxaban (XARELTO) 20 MG TABS tablet Take 1 tablet (20 mg total) by mouth daily with supper. 30 tablet 3  . sertraline (ZOLOFT) 50  MG tablet Take 50 mg by mouth daily.    . sildenafil (VIAGRA) 100 MG tablet Take 50-100 mg by mouth daily as needed for erectile dysfunction.     No current facility-administered medications for this encounter.    Allergies  Allergen Reactions  . Penicillins Rash    Social History   Socioeconomic History  . Marital status: Married    Spouse name: Not on file  . Number of children: Not on file  . Years of education: Not on file  . Highest education level: Not on file  Occupational History  . Not on file  Tobacco Use  . Smoking status: Former Research scientist (life sciences)  . Smokeless tobacco: Never Used  Substance and Sexual Activity  . Alcohol use: Yes    Comment: none during the weekn, 6-12 beers on the weekend  . Drug use: Never  . Sexual activity: Not on file  Other Topics Concern  . 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 Difficulty of Paying Living Expenses:   Food Insecurity:   . Worried About Charity fundraiser in the Last Year:   . Arboriculturist in the Last Year:   Transportation Needs:   . Film/video editor (Medical):   Marland Kitchen Lack of Transportation (Non-Medical):   Physical Activity:   . Days of Exercise per Week:   . Minutes of Exercise per Session:   Stress:   . Feeling of Stress :   Social Connections:   . Frequency of Communication with Friends and Family:   . Frequency of Social Gatherings with Friends and Family:   . Attends Religious Services:   . Active Member of Clubs or Organizations:   . Attends Archivist Meetings:   Marland Kitchen Marital Status:   Intimate Partner Violence:   . Fear of Current or Ex-Partner:   . Emotionally Abused:   Marland Kitchen Physically Abused:   . Sexually Abused:     Family History  Problem Relation Age of Onset  . Hypotension Mother   . Heart attack  Father     ROS- All systems are reviewed and negative except as per the HPI above  Physical Exam: Vitals:   10/05/19 1540  BP: (!) 156/86  Pulse: (!) 132  Weight: 101.6 kg  Height: 6\' 4"  (1.93 m)   Wt Readings from Last 3 Encounters:  10/05/19 101.6 kg  09/29/19 100 kg  09/14/19 100.6 kg    Labs: Lab Results  Component Value Date   NA 141 09/14/2019   K 4.4 09/14/2019   CL 107 09/14/2019   CO2 25  09/14/2019   GLUCOSE 89 09/14/2019   BUN 28 (H) 09/14/2019   CREATININE 1.24 09/14/2019   CALCIUM 9.2 09/14/2019   No results found for: INR No results found for: CHOL, HDL, LDLCALC, TRIG   GEN- The patient is well appearing, alert and oriented x 3 today.   Head- normocephalic, atraumatic Eyes-  Sclera clear, conjunctiva pink Ears- hearing intact Oropharynx- clear Neck- supple, no JVP Lymph- no cervical lymphadenopathy Lungs- Clear to ausculation bilaterally, normal work of breathing Heart- irregular rate and rhythm, no murmurs, rubs or gallops, PMI not laterally displaced GI- soft, NT, ND, + BS Extremities- no clubbing, cyanosis, or edema MS- no significant deformity or atrophy Skin- no rash or lesion Psych- euthymic mood, full affect Neuro- strength and sensation are intact  EKG-afib at 132 bpm, qrs int 94 bpm, qtc 497 ms  Echo-1. Left ventricular ejection fraction, by visual estimation, is 50 to  55%. The left ventricle has low normal function. There is no left  ventricular hypertrophy. GLS -18.4%.  2. Left ventricular diastolic parameters are consistent with Grade I  diastolic dysfunction (impaired relaxation).  3. Mildly dilated left ventricular internal cavity size.  4. The left ventricle has no regional wall motion abnormalities.  5. Global right ventricle has normal systolic function.The right  ventricular size is normal. No increase in right ventricular wall  thickness.  6. Left atrial size was normal.  7. Right atrial size was normal.  8. The  mitral valve is normal in structure. Trivial mitral valve  regurgitation. No evidence of mitral stenosis.  9. The tricuspid valve is normal in structure.  10. The aortic valve is tricuspid. Aortic valve regurgitation is not  visualized. No evidence of aortic valve sclerosis or stenosis.  11. There is mild dilatation of the ascending aorta measuring 40 mm.  12. The tricuspid regurgitant velocity is 2.09 m/s, and with an assumed  right atrial pressure of 3 mmHg, the estimated right ventricular systolic  pressure is normal at 20.5 mmHg.  13. The inferior vena cava is normal in size with greater than 50%  respiratory variability, suggesting right atrial pressure of 3 mmHg.    Assessment and Plan: 1. Persistent  afib   Successful cardioversion but ERAF  He did not note any difference and did not know he was in afib today  Go back to  cardizem to 120 mg bid  Stop flecainide as it has not been effective  CHA2DS2VASc score of 0 Continue  on Xarelto 20 mg qd started 4/24   2. Lifestyle issues  Encouraged to cut down on beer use, ideally no more than 2 servings a week  Regular exercise encouraged  I have enocuraged a means to track afib since he is not aware when he is in it.   3. Severe sleep apnea Was told positive sleep study Pending start of cpap Encouraged to get on cpap as soon as possible to help be able to maintain SR We prompted the person that is trying to get cpap titration set up to expedite the process   4. Left arm paresthesia Involves  the 1st thru third finger radiation into left inner arm to axilla The sensation is there almost continuously, noted last fall  Is not tied to activity or any chest pain I encouraged him to see PCP to have further evaluated   I will request an appointment with Dr. Rayann Heman to discuss next step, discussed ablation vrs Tikosyn with him     Geroge Baseman. Kayleen Memos,  [facility] [address] [place], [place]  [phone]

## 2019-10-19 ENCOUNTER — Telehealth: Payer: Self-pay | Admitting: *Deleted

## 2019-10-19 NOTE — Telephone Encounter (Signed)
PA submitted via web portal to [facility] for CPAP titration.

## 2019-10-20 ENCOUNTER — Telehealth: Payer: Self-pay | Admitting: Cardiology

## 2019-10-20 NOTE — Telephone Encounter (Signed)
Stacy with [facility] is calling to follow up in regards to PA for CPAP titration. Please return call to discuss at [phone].

## 2019-10-20 NOTE — Telephone Encounter (Signed)
Follow Up:    Please call, concerning pt's C-Pap.

## 2019-10-20 NOTE — Telephone Encounter (Signed)
Wrong provider

## 2019-10-23 NOTE — Telephone Encounter (Signed)
Per Gae Bon he needs to be set up for titration at sleep lab.  See 5/26 phone note

## 2019-10-23 NOTE — Telephone Encounter (Addendum)
Titration denied does not meet criteria and is not medically necessary. Reached out to Marlaine Hind at [facility] who asked for the sleep study, the office notes and the order for the cpap titration. All clinicals have been faxed to [place] and she will send back to the review board for the decision and notify Mariann Laster.

## 2019-10-23 NOTE — Telephone Encounter (Signed)
Titration denied does not meet criteria and is not medically necessary. Reached out to Marlaine Hind at [facility] who asked for the sleep study, the office notes and the order for the cpap titration. All clinicals have been faxed to [place] and she will send back to the review board for the decision and notify Mariann Laster.

## 2019-10-24 NOTE — Telephone Encounter (Signed)
Follow Up  Juliann Pulse from [facility] is calling in to let office know that a denial letter [phone]) will be sent for the sleep study [phone]) coverage request. States that patient does not meet criteria for an in lab sleep study, but does meet the criteria for an at home sleep study test. States that appeal form will be attached to the denial letter and if Doctor would like to do a peer to peer that it can be done at [phone].

## 2019-10-24 NOTE — Telephone Encounter (Signed)
Staff message sent to dr Radford Pax: In lab cpap titration denied please write APAP settings

## 2019-10-25 NOTE — Telephone Encounter (Signed)
Staff message sent to dr Radford Pax: In lab cpap titration denied please write APAP settings  RE: IN LAB CPAP TITRATIO DENIED  Turner, Eber Hong, MD  Freada Bergeron, CMA Please order ResMed CPAP on auto from 4 to 20cm H2O with heated humidity and mask of choice and get a download 2 weeks later. FOllowup with me in 8 weeks   Fransico Him

## 2019-10-25 NOTE — Telephone Encounter (Signed)
Upon patient request DME selection is [facility]. Patient understands she will be contacted by [facility] to set up her cpap. Patient understands to call if [facility] does not contact her with new setup in a timely manner. Patient understands they will be called once confirmation has been received from [facility] that they have received their new machine to schedule 10 week follow up appointment.  [facility] notified of new cpap order  Please add to airview Patient was grateful for the call and thanked me. 

## 2019-10-26 NOTE — Telephone Encounter (Signed)
See 10/05/19 phone encounter.

## 2019-10-27 ENCOUNTER — Telehealth: Payer: Self-pay

## 2019-10-27 NOTE — Telephone Encounter (Signed)
Called patient to confirm telephone telehealth visit with Dr. Rayann Heman on 10/30/19 at 4:15 pm

## 2019-10-30 ENCOUNTER — Other Ambulatory Visit: Payer: Self-pay

## 2019-10-30 ENCOUNTER — Encounter: Payer: Self-pay | Admitting: Internal Medicine

## 2019-10-30 ENCOUNTER — Telehealth (INDEPENDENT_AMBULATORY_CARE_PROVIDER_SITE_OTHER): Payer: 59 | Admitting: Internal Medicine

## 2019-10-30 VITALS — Ht 76.0 in | Wt 224.0 lb

## 2019-10-30 DIAGNOSIS — G4733 Obstructive sleep apnea (adult) (pediatric): Secondary | ICD-10-CM | POA: Diagnosis not present

## 2019-10-30 DIAGNOSIS — I4819 Other persistent atrial fibrillation: Secondary | ICD-10-CM

## 2019-10-30 NOTE — Progress Notes (Signed)
Electrophysiology TeleHealth Note   Due to national recommendations of social distancing due to COVID 19, an audio/video telehealth visit is felt to be most appropriate for this patient at this time.  See MyChart message from today for the patient's consent to telehealth for [facility].  Date:  10/30/2019   ID:  Dylan Bennett, DOB 1975/09/13, MRN [id number]  Location: patient's home  Provider location:  [place]  Evaluation Performed: Follow-up visit  PCP:  Aurea Graff, PA-C (Inactive)   Electrophysiologist:  Dr Rayann Heman  Chief Complaint:  AF follow up  History of Present Illness:    Dylan Bennett is a 44 y.o. male who presents via telehealth conferencing today.  Since last being seen in our clinic, the patient reports doing reasonably well.  Today, he denies symptoms of palpitations, chest pain, shortness of breath,  lower extremity edema, dizziness, presyncope, or syncope.  The patient is otherwise without complaint today.   Past Medical History:  Diagnosis Date  . Atrial fibrillation ([place]) 05/2019  . Colorectal cancer ([place]) 2001   treated with colon resection and permanent coloscopy.  He also had radiation and chemotherapy  . OSA (obstructive sleep apnea)     Past Surgical History:  Procedure Laterality Date  . ABDOMINAL PERINEAL BOWEL RESECTION    . CARDIOVERSION N/A 09/29/2019   Procedure: CARDIOVERSION;  Surgeon: Acie Fredrickson Wonda Cheng, MD;  Location: [facility];  Service: Cardiovascular;  Laterality: N/A;  . PORTACATH PLACEMENT  2001    Current Outpatient Medications  Medication Sig Dispense Refill  . amphetamine-dextroamphetamine (ADDERALL XR) 25 MG 24 hr capsule Take 25 mg by mouth every morning.    . diltiazem (CARDIZEM CD) 120 MG 24 hr capsule Take 1 capsule (120 mg total) by mouth in the morning and at bedtime. 60 capsule 3  . rivaroxaban (XARELTO) 20 MG TABS tablet Take 1 tablet (20 mg total) by mouth daily with supper. 30 tablet 3  .  sertraline (ZOLOFT) 50 MG tablet Take 50 mg by mouth daily.    . sildenafil (VIAGRA) 100 MG tablet Take 50-100 mg by mouth daily as needed for erectile dysfunction.     No current facility-administered medications for this visit.    Allergies:   Penicillins   Social History:  The patient  reports that he has quit smoking. He has never used smokeless tobacco. He reports current alcohol use. He reports that he does not use drugs.   ROS:  Please see the history of present illness.   All other systems are personally reviewed and negative.    Exam:    Vital Signs:  Ht 6\' 4"  (1.93 m)   Wt 224 lb (101.6 kg)   BMI 27.27 kg/m   Well sounding and appearing, alert and conversant, regular work of breathing,  good skin color Eyes- anicteric, neuro- grossly intact, skin- no apparent rash or lesions or cyanosis, mouth- oral mucosa is pink  Labs/Other Tests and Data Reviewed:    Recent Labs: 06/09/2019: TSH 1.040 09/14/2019: BUN 28; Creatinine, Ser 1.24; Hemoglobin 14.7; Platelets 182; Potassium 4.4; Sodium 141   Wt Readings from Last 3 Encounters:  10/30/19 224 lb (101.6 kg)  10/05/19 224 lb (101.6 kg)  09/29/19 220 lb 7.4 oz (100 kg)       ASSESSMENT & PLAN:    1.  Persistent atrial fibrillation He has failed Flecainide and had ERAF post cardioversion CHADS2VASC is 0 He is relatively asymptomatic but is also young and would like to establish  and maintain SR He is very busy with work right now and is not sure he could take time off work to pursue AF ablation.  Long discussion with he and his wife today regarding treatment options. They would like to work on obtaining CPAP and treating OSA for now and revisit ablation in a few weeks.   2.  OSA He has had trouble getting CPAP titration - [employer] has denied CPAP titration Will reach back out to Dr Theodosia Blender team to see what next steps can be to help get CPAP started.  3.  Lifestyle modification Alcohol cessation advised      Risks,  benefits and potential toxicities for medications prescribed and/or refilled reviewed with patient today.   Follow-up:  6 weeks with me    Patient Risk:  after full review of this patients clinical status, I feel that they are at moderate risk at this time.  Today, I have spent 15 minutes with the patient with telehealth technology discussing arrhythmia management .    Army Fossa, MD  10/30/2019 4:37 PM     [place] [id number] [phone] (office) [phone] (fax)

## 2019-10-31 NOTE — Telephone Encounter (Addendum)
APAP settings [facility] to [facility] but after talking to [facility] at [facility] the order was stopped because [facility] has already received the payment for a cpap unit from the patient and will be sending the unit out to the patient as soon as they can get him on the phone. Patient expressed anger and hostility at me over the phone for the whole process. Patient ask for an e-mail to be sent to his e-mail with contact information for people he can get on the phone because he says he can't get nobody on the phone at [place]. I supplied him with my work phone number, Water quality scientist ([facility]) phone number and Dr Theodosia Blender e-mail to reach her. I will also contacted HIM to mail or email  him his results for his sleep study. Pt is agreeable to treatment.   Sueanne Margarita, MD  [name], MD; Freada Bergeron, CMA Unfortunately it took a while to try to get in lab CPAP titration ordered and insurance denied in lab study.    Gae Bon,   I placed an order on 6/23 (see your telephone encounter)>>please find out why he has not heard from DME yet   Traci          ----- Message -----  From: [name], MD  Sent: 10/30/2019  4:18 PM EDT  To: Sueanne Margarita, MD   Patient and his wife are frustrated that they have not been able to start treatment for his severe OSA. Can you follow-up with them?

## 2019-11-01 NOTE — Telephone Encounter (Signed)
DME changed to [facility].

## 2019-11-18 NOTE — Telephone Encounter (Signed)
Patient set up with [facility].

## 2019-12-21 ENCOUNTER — Other Ambulatory Visit: Payer: Self-pay

## 2019-12-21 ENCOUNTER — Ambulatory Visit: Payer: 59 | Admitting: Internal Medicine

## 2019-12-21 ENCOUNTER — Encounter: Payer: Self-pay | Admitting: Internal Medicine

## 2019-12-21 DIAGNOSIS — G4733 Obstructive sleep apnea (adult) (pediatric): Secondary | ICD-10-CM

## 2019-12-21 DIAGNOSIS — I4891 Unspecified atrial fibrillation: Secondary | ICD-10-CM | POA: Diagnosis not present

## 2019-12-21 DIAGNOSIS — I4819 Other persistent atrial fibrillation: Secondary | ICD-10-CM

## 2019-12-21 MED ORDER — METOPROLOL TARTRATE 100 MG PO TABS
ORAL_TABLET | ORAL | 0 refills | Status: DC
Start: 2019-12-21 — End: 2020-01-23

## 2019-12-21 MED ORDER — DILTIAZEM HCL ER COATED BEADS 120 MG PO CP24
ORAL_CAPSULE | ORAL | 3 refills | Status: DC
Start: 2019-12-21 — End: 2020-03-20

## 2019-12-21 NOTE — Progress Notes (Signed)
PCP: Aurea Graff, PA-C (Inactive)   Primary EP: Dr Rayann Heman  Dylan Bennett is a 44 y.o. male who presents today for routine electrophysiology followup.  Since last being seen in our clinic, the patient reports doing very well.  Today, he denies symptoms of palpitations, chest pain, shortness of breath,  lower extremity edema, dizziness, presyncope, or syncope.  The patient is otherwise without complaint today.   Past Medical History:  Diagnosis Date  . Atrial fibrillation ([place]) 05/2019  . Colorectal cancer ([place]) 2001   treated with colon resection and permanent coloscopy.  He also had radiation and chemotherapy  . OSA (obstructive sleep apnea)    Past Surgical History:  Procedure Laterality Date  . ABDOMINAL PERINEAL BOWEL RESECTION    . CARDIOVERSION N/A 09/29/2019   Procedure: CARDIOVERSION;  Surgeon: Nahser, Wonda Cheng, MD;  Location: [facility] ENDOSCOPY;  Service: Cardiovascular;  Laterality: N/A;  . PORTACATH PLACEMENT  2001    ROS- all systems are reviewed and negatives except as per HPI above  Current Outpatient Medications  Medication Sig Dispense Refill  . amphetamine-dextroamphetamine (ADDERALL XR) 25 MG 24 hr capsule Take 25 mg by mouth every morning.    . diltiazem (CARDIZEM CD) 120 MG 24 hr capsule Take 1 capsule (120 mg total) by mouth in the morning and at bedtime. 60 capsule 3  . rivaroxaban (XARELTO) 20 MG TABS tablet Take 1 tablet (20 mg total) by mouth daily with supper. 30 tablet 3  . sertraline (ZOLOFT) 50 MG tablet Take 50 mg by mouth daily.    . sildenafil (VIAGRA) 100 MG tablet Take 50-100 mg by mouth daily as needed for erectile dysfunction.     No current facility-administered medications for this visit.    Physical Exam: Vitals:   12/21/19 1512  BP: 132/78  Pulse: (!) 116  SpO2: 96%  Weight: 226 lb (102.5 kg)  Height: 6\' 4"  (1.93 m)    GEN- The patient is well appearing, alert and oriented x 3 today.   Head- normocephalic, atraumatic Eyes-   Sclera clear, conjunctiva pink Ears- hearing intact Oropharynx- clear Lungs-   normal work of breathing Heart- tachycardic irregular rhythm, GI- soft  Extremities- no clubbing, cyanosis, or edema  Wt Readings from Last 3 Encounters:  12/21/19 226 lb (102.5 kg)  10/30/19 224 lb (101.6 kg)  10/05/19 224 lb (101.6 kg)    EKG tracing ordered today is personally reviewed and shows afib with RVR  Assessment and Plan:  1. persistent afib The patient has symptomatic, recurrent atrial fibrillation. V rates are elevated I worry about his risks of CHF he has failed medical therapy with flecainide. Chads2vasc score is 0.  He is on xarelto Therapeutic strategies for afib including medicine (tikosyn, amiodarone) and ablation were discussed in detail with the patient today. Risk, benefits, and alternatives to EP study and radiofrequency ablation for afib were also discussed in detail today. These risks include but are not limited to stroke, bleeding, vascular damage, tamponade, perforation, damage to the esophagus, lungs, and other structures, pulmonary vein stenosis, worsening renal function, and death. The patient understands these risk and wishes to proceed.  We will therefore proceed with catheter ablation at the next available time.  Carto, ICE, anesthesia are requested for the procedure.  Will also obtain cardiac CT prior to the procedure to exclude LAA thrombus and further evaluate atrial anatomy.  2. OSA He is followed by Dr Radford Pax He has not yet started to use his CPAP  3. ETOH  Cessation advised   Risks, benefits and potential toxicities for medications prescribed and/or refilled reviewed with patient today.   [name] MD, [facility] 12/21/2019 3:41 PM

## 2019-12-21 NOTE — Patient Instructions (Addendum)
Medication Instructions:  Your physician has recommended you make the following change in your medication:   1.  INcrease your diltiazem CD 120 mg-  Take 2 tablets (240 mg) in the AM and 1 tablet (120 mg) in the PM   Labwork: None ordered.  Testing/Procedures: None ordered.  Follow-Up:  SEE INSTRUCTION LETTER  Any Other Special Instructions Will Be Listed Below (If Applicable).  If you need a refill on your cardiac medications before your next appointment, please call your pharmacy.    Cardiac Ablation Cardiac ablation is a procedure to disable (ablate) a small amount of heart tissue in very specific places. The heart has many electrical connections. Sometimes these connections are abnormal and can cause the heart to beat very fast or irregularly. Ablating some of the problem areas can improve the heart rhythm or return it to normal. Ablation may be done for people who:  Have Wolff-Parkinson-White syndrome.  Have fast heart rhythms (tachycardia).  Have taken medicines for an abnormal heart rhythm (arrhythmia) that were not effective or caused side effects.  Have a high-risk heartbeat that may be life-threatening. During the procedure, a small incision is made in the neck or the groin, and a long, thin, flexible tube (catheter) is inserted into the incision and moved to the heart. Small devices (electrodes) on the tip of the catheter will send out electrical currents. A type of X-ray (fluoroscopy) will be used to help guide the catheter and to provide images of the heart. Tell a health care provider about:  Any allergies you have.  All medicines you are taking, including vitamins, herbs, eye drops, creams, and over-the-counter medicines.  Any problems you or family members have had with anesthetic medicines.  Any blood disorders you have.  Any surgeries you have had.  Any medical conditions you have, such as kidney failure.  Whether you are pregnant or may be  pregnant. What are the risks? Generally, this is a safe procedure. However, problems may occur, including:  Infection.  Bruising and bleeding at the catheter insertion site.  Bleeding into the chest, especially into the sac that surrounds the heart. This is a serious complication.  Stroke or blood clots.  Damage to other structures or organs.  Allergic reaction to medicines or dyes.  Need for a permanent pacemaker if the normal electrical system is damaged. A pacemaker is a small computer that sends electrical signals to the heart and helps your heart beat normally.  The procedure not being fully effective. This may not be recognized until months later. Repeat ablation procedures are sometimes required. What happens before the procedure?  Follow instructions from your health care provider about eating or drinking restrictions.  Ask your health care provider about: ? Changing or stopping your regular medicines. This is especially important if you are taking diabetes medicines or blood thinners. ? Taking medicines such as aspirin and ibuprofen. These medicines can thin your blood. Do not take these medicines before your procedure if your health care provider instructs you not to.  Plan to have someone take you home from the hospital or clinic.  If you will be going home right after the procedure, plan to have someone with you for 24 hours. What happens during the procedure?  To lower your risk of infection: ? Your health care team will wash or sanitize their hands. ? Your skin will be washed with soap. ? Hair may be removed from the incision area.  An IV tube will be inserted into one  of your veins.  You will be given a medicine to help you relax (sedative).  The skin on your neck or groin will be numbed.  An incision will be made in your neck or your groin.  A needle will be inserted through the incision and into a large vein in your neck or groin.  A catheter will be  inserted into the needle and moved to your heart.  Dye may be injected through the catheter to help your surgeon see the area of the heart that needs treatment.  Electrical currents will be sent from the catheter to ablate heart tissue in desired areas. There are three types of energy that may be used to ablate heart tissue: ? Heat (radiofrequency energy). ? Laser energy. ? Extreme cold (cryoablation).  When the necessary tissue has been ablated, the catheter will be removed.  Pressure will be held on the catheter insertion area to prevent excessive bleeding.  A bandage (dressing) will be placed over the catheter insertion area. The procedure may vary among health care providers and hospitals. What happens after the procedure?  Your blood pressure, heart rate, breathing rate, and blood oxygen level will be monitored until the medicines you were given have worn off.  Your catheter insertion area will be monitored for bleeding. You will need to lie still for a few hours to ensure that you do not bleed from the catheter insertion area.  Do not drive for 24 hours or as long as directed by your health care provider. Summary  Cardiac ablation is a procedure to disable (ablate) a small amount of heart tissue in very specific places. Ablating some of the problem areas can improve the heart rhythm or return it to normal.  During the procedure, electrical currents will be sent from the catheter to ablate heart tissue in desired areas. This information is not intended to replace advice given to you by your health care provider. Make sure you discuss any questions you have with your health care provider. Document Revised: 10/11/2017 Document Reviewed: 03/09/2016 Elsevier Patient Education  [place].

## 2020-01-04 ENCOUNTER — Other Ambulatory Visit: Payer: 59

## 2020-01-05 ENCOUNTER — Other Ambulatory Visit: Payer: 59 | Admitting: *Deleted

## 2020-01-05 ENCOUNTER — Other Ambulatory Visit: Payer: Self-pay

## 2020-01-05 DIAGNOSIS — I4819 Other persistent atrial fibrillation: Secondary | ICD-10-CM

## 2020-01-06 LAB — BASIC METABOLIC PANEL
BUN/Creatinine Ratio: 17 (ref 9–20)
BUN: 23 mg/dL (ref 6–24)
CO2: 25 mmol/L (ref 20–29)
Calcium: 9.2 mg/dL (ref 8.7–10.2)
Chloride: 108 mmol/L — ABNORMAL HIGH (ref 96–106)
Creatinine, Ser: 1.36 mg/dL — ABNORMAL HIGH (ref 0.76–1.27)
GFR calc Af Amer: 73 mL/min/{1.73_m2} (ref >59)
GFR calc non Af Amer: 63 mL/min/{1.73_m2} (ref >59)
Glucose: 69 mg/dL (ref 65–99)
Potassium: 4.5 mmol/L (ref 3.5–5.2)
Sodium: 144 mmol/L (ref 134–144)

## 2020-01-06 LAB — CBC WITH DIFFERENTIAL/PLATELET
Basophils Absolute: 0.1 10*3/uL (ref 0.0–0.2)
Basos: 1 %
EOS (ABSOLUTE): 0 10*3/uL (ref 0.0–0.4)
Eos: 1 %
Hematocrit: 42.1 % (ref 37.5–51.0)
Hemoglobin: 14.2 g/dL (ref 13.0–17.7)
Immature Grans (Abs): 0 10*3/uL (ref 0.0–0.1)
Immature Granulocytes: 0 %
Lymphocytes Absolute: 1.5 10*3/uL (ref 0.7–3.1)
Lymphs: 25 %
MCH: 31.1 pg (ref 26.6–33.0)
MCHC: 33.7 g/dL (ref 31.5–35.7)
MCV: 92 fL (ref 79–97)
Monocytes Absolute: 0.4 10*3/uL (ref 0.1–0.9)
Monocytes: 7 %
Neutrophils Absolute: 3.9 10*3/uL (ref 1.4–7.0)
Neutrophils: 66 %
Platelets: 145 10*3/uL — ABNORMAL LOW (ref 150–450)
RBC: 4.57 x10E6/uL (ref 4.14–5.80)
RDW: 12.9 % (ref 11.6–15.4)
WBC: 6 10*3/uL (ref 3.4–10.8)

## 2020-01-07 ENCOUNTER — Other Ambulatory Visit (HOSPITAL_COMMUNITY): Payer: Self-pay | Admitting: Nurse Practitioner

## 2020-01-16 ENCOUNTER — Telehealth (HOSPITAL_COMMUNITY): Payer: Self-pay | Admitting: Emergency Medicine

## 2020-01-16 NOTE — Telephone Encounter (Signed)
Reaching out to patient to offer assistance regarding upcoming cardiac imaging study; pt verbalizes understanding of appt date/time, parking situation and where to check in, pre-test NPO status and medications ordered, and verified current allergies; name and call back number provided for further questions should they arise Shirlie Enck RN Navigator Cardiac Imaging [facility] [phone] office [phone] cell 

## 2020-01-18 ENCOUNTER — Encounter (HOSPITAL_COMMUNITY): Payer: Self-pay

## 2020-01-18 ENCOUNTER — Ambulatory Visit (HOSPITAL_COMMUNITY)
Admission: RE | Admit: 2020-01-18 | Discharge: 2020-01-18 | Disposition: A | Discharge status: Home / Self Care | Payer: 59 | Source: Ambulatory Visit | Attending: Internal Medicine | Admitting: Internal Medicine

## 2020-01-18 DIAGNOSIS — I4891 Unspecified atrial fibrillation: Secondary | ICD-10-CM | POA: Diagnosis present

## 2020-01-18 MED ORDER — IOHEXOL 350 MG/ML SOLN
80.0000 mL | Freq: Once | INTRAVENOUS | Status: AC | PRN
  Administered 2020-01-18: 80 mL via INTRAVENOUS

## 2020-01-19 ENCOUNTER — Other Ambulatory Visit (HOSPITAL_COMMUNITY)
Admission: RE | Admit: 2020-01-19 | Discharge: 2020-01-19 | Disposition: A | Discharge status: Home / Self Care | Payer: 59 | Source: Ambulatory Visit | Attending: Internal Medicine | Admitting: Internal Medicine

## 2020-01-19 DIAGNOSIS — Z01812 Encounter for preprocedural laboratory examination: Secondary | ICD-10-CM | POA: Diagnosis present

## 2020-01-19 DIAGNOSIS — Z20822 Contact with and (suspected) exposure to covid-19: Secondary | ICD-10-CM | POA: Diagnosis not present

## 2020-01-20 LAB — SARS CORONAVIRUS 2 (TAT 6-24 HRS): SARS Coronavirus 2: NEGATIVE

## 2020-01-22 NOTE — Progress Notes (Signed)
Instructed patient on the following items: Arrival time 0530 Nothing to eat or drink after midnight No meds AM of procedure Responsible person to drive you home and stay with you for 24 hrs  Have you missed any doses of anti-coagulant Xarelto- hasn't missed any doses    

## 2020-01-22 NOTE — Anesthesia Preprocedure Evaluation (Addendum)
Anesthesia Evaluation  Patient identified by MRN, date of birth, ID band Patient awake    Reviewed: Allergy & Precautions, NPO status , Patient's Chart, lab work & pertinent test results  Airway Mallampati: II  TM Distance: >3 FB     Dental   Pulmonary sleep apnea , Patient abstained from smoking., former smoker,    breath sounds clear to auscultation       Cardiovascular + dysrhythmias Atrial Fibrillation  Rhythm:Irregular Rate:Normal     Neuro/Psych    GI/Hepatic negative GI ROS, Neg liver ROS,   Endo/Other  negative endocrine ROS  Renal/GU negative Renal ROS     Musculoskeletal   Abdominal   Peds  Hematology   Anesthesia Other Findings   Reproductive/Obstetrics                            Anesthesia Physical Anesthesia Plan  ASA: III  Anesthesia Plan: General   Post-op Pain Management:    Induction: Intravenous  PONV Risk Score and Plan: 2 and Midazolam, Ondansetron and Dexamethasone  Airway Management Planned: Oral ETT  Additional Equipment:   Intra-op Plan:   Post-operative Plan: Extubation in OR and Possible Post-op intubation/ventilation  Informed Consent: I have reviewed the patients History and Physical, chart, labs and discussed the procedure including the risks, benefits and alternatives for the proposed anesthesia with the patient or authorized representative who has indicated his/her understanding and acceptance.     Dental advisory given  Plan Discussed with: CRNA and Anesthesiologist  Anesthesia Plan Comments:        Anesthesia Quick Evaluation

## 2020-01-23 ENCOUNTER — Ambulatory Visit (HOSPITAL_COMMUNITY): Payer: 59 | Admitting: Anesthesiology

## 2020-01-23 ENCOUNTER — Encounter (HOSPITAL_COMMUNITY): Payer: Self-pay | Admitting: Internal Medicine

## 2020-01-23 ENCOUNTER — Other Ambulatory Visit: Payer: Self-pay

## 2020-01-23 ENCOUNTER — Ambulatory Visit (HOSPITAL_COMMUNITY)
Admission: RE | Admit: 2020-01-23 | Discharge: 2020-01-24 | Disposition: A | Discharge status: Home / Self Care | Payer: 59 | Attending: Internal Medicine | Admitting: Internal Medicine

## 2020-01-23 ENCOUNTER — Ambulatory Visit (HOSPITAL_COMMUNITY)
Admission: RE | Disposition: A | Discharge status: Home / Self Care | Payer: Self-pay | Source: Home / Self Care | Attending: Internal Medicine

## 2020-01-23 DIAGNOSIS — Z85038 Personal history of other malignant neoplasm of large intestine: Secondary | ICD-10-CM | POA: Insufficient documentation

## 2020-01-23 DIAGNOSIS — Z79899 Other long term (current) drug therapy: Secondary | ICD-10-CM | POA: Insufficient documentation

## 2020-01-23 DIAGNOSIS — Z9221 Personal history of antineoplastic chemotherapy: Secondary | ICD-10-CM | POA: Diagnosis not present

## 2020-01-23 DIAGNOSIS — I4819 Other persistent atrial fibrillation: Secondary | ICD-10-CM | POA: Diagnosis present

## 2020-01-23 DIAGNOSIS — Z923 Personal history of irradiation: Secondary | ICD-10-CM | POA: Diagnosis not present

## 2020-01-23 DIAGNOSIS — G4733 Obstructive sleep apnea (adult) (pediatric): Secondary | ICD-10-CM | POA: Diagnosis not present

## 2020-01-23 DIAGNOSIS — Z7901 Long term (current) use of anticoagulants: Secondary | ICD-10-CM | POA: Diagnosis not present

## 2020-01-23 HISTORY — PX: ATRIAL FIBRILLATION ABLATION: EP1191

## 2020-01-23 LAB — POCT ACTIVATED CLOTTING TIME
Activated Clotting Time: 219 seconds
Activated Clotting Time: 257 seconds
Activated Clotting Time: 279 seconds

## 2020-01-23 SURGERY — ATRIAL FIBRILLATION ABLATION
Anesthesia: General

## 2020-01-23 MED ORDER — SERTRALINE HCL 50 MG PO TABS
50.0000 mg | ORAL_TABLET | Freq: Every day | ORAL | Status: DC
  Administered 2020-01-23 – 2020-01-24 (×2): 50 mg via ORAL
  Filled 2020-01-23 (×2): qty 1

## 2020-01-23 MED ORDER — HEPARIN (PORCINE) IN NACL 2-0.9 UNITS/ML
INTRAMUSCULAR | Status: AC | PRN
  Administered 2020-01-23: 500 mL

## 2020-01-23 MED ORDER — SODIUM CHLORIDE 0.9 % IV SOLN: 250.0000 mL | INTRAVENOUS | Status: DC | PRN

## 2020-01-23 MED ORDER — ONDANSETRON HCL 4 MG/2ML IJ SOLN
INTRAMUSCULAR | Status: DC | PRN
  Administered 2020-01-23: 4 mg via INTRAVENOUS

## 2020-01-23 MED ORDER — DEXAMETHASONE SODIUM PHOSPHATE 10 MG/ML IJ SOLN
INTRAMUSCULAR | Status: DC | PRN
  Administered 2020-01-23: 4 mg via INTRAVENOUS

## 2020-01-23 MED ORDER — FENTANYL CITRATE (PF) 100 MCG/2ML IJ SOLN
INTRAMUSCULAR | Status: DC | PRN
  Administered 2020-01-23 (×2): 50 ug via INTRAVENOUS

## 2020-01-23 MED ORDER — PROTAMINE SULFATE 10 MG/ML IV SOLN
INTRAVENOUS | Status: DC | PRN
  Administered 2020-01-23: 30 mg via INTRAVENOUS

## 2020-01-23 MED ORDER — PHENYLEPHRINE 40 MCG/ML (10ML) SYRINGE FOR IV PUSH (FOR BLOOD PRESSURE SUPPORT)
PREFILLED_SYRINGE | INTRAVENOUS | Status: DC | PRN
  Administered 2020-01-23: 40 ug via INTRAVENOUS
  Administered 2020-01-23 (×2): 80 ug via INTRAVENOUS

## 2020-01-23 MED ORDER — ACETAMINOPHEN 325 MG PO TABS: 650.0000 mg | ORAL_TABLET | ORAL | Status: DC | PRN

## 2020-01-23 MED ORDER — HYDROCODONE-ACETAMINOPHEN 5-325 MG PO TABS: 1.0000 | ORAL_TABLET | ORAL | Status: DC | PRN

## 2020-01-23 MED ORDER — SODIUM CHLORIDE 0.9% FLUSH: 3.0000 mL | INTRAVENOUS | Status: DC | PRN

## 2020-01-23 MED ORDER — FENTANYL CITRATE (PF) 100 MCG/2ML IJ SOLN
25.0000 ug | Freq: Once | INTRAMUSCULAR | Status: AC
  Administered 2020-01-23: 25 ug via INTRAVENOUS

## 2020-01-23 MED ORDER — HEPARIN (PORCINE) IN NACL 1000-0.9 UT/500ML-% IV SOLN
INTRAVENOUS | Status: AC
  Filled 2020-01-23: qty 500

## 2020-01-23 MED ORDER — MIDAZOLAM HCL 5 MG/5ML IJ SOLN
INTRAMUSCULAR | Status: DC | PRN
  Administered 2020-01-23: 2 mg via INTRAVENOUS

## 2020-01-23 MED ORDER — HEPARIN SODIUM (PORCINE) 1000 UNIT/ML IJ SOLN
INTRAMUSCULAR | Status: AC
  Filled 2020-01-23: qty 2

## 2020-01-23 MED ORDER — ONDANSETRON HCL 4 MG/2ML IJ SOLN: 4.0000 mg | Freq: Four times a day (QID) | INTRAMUSCULAR | Status: DC | PRN

## 2020-01-23 MED ORDER — PROPOFOL 10 MG/ML IV BOLUS
INTRAVENOUS | Status: DC | PRN
  Administered 2020-01-23: 140 mg via INTRAVENOUS
  Administered 2020-01-23: 60 mg via INTRAVENOUS

## 2020-01-23 MED ORDER — PHENYLEPHRINE HCL-NACL 10-0.9 MG/250ML-% IV SOLN
INTRAVENOUS | Status: DC | PRN
  Administered 2020-01-23: 15 ug/min via INTRAVENOUS

## 2020-01-23 MED ORDER — SODIUM CHLORIDE 0.9 % IV SOLN: INTRAVENOUS | Status: DC

## 2020-01-23 MED ORDER — SODIUM CHLORIDE 0.9% FLUSH: 3.0000 mL | Freq: Two times a day (BID) | INTRAVENOUS | Status: DC

## 2020-01-23 MED ORDER — LIDOCAINE 2% (20 MG/ML) 5 ML SYRINGE
INTRAMUSCULAR | Status: DC | PRN
  Administered 2020-01-23: 80 mg via INTRAVENOUS

## 2020-01-23 MED ORDER — HEPARIN SODIUM (PORCINE) 1000 UNIT/ML IJ SOLN
INTRAMUSCULAR | Status: DC | PRN
  Administered 2020-01-23: 1000 [IU] via INTRAVENOUS
  Administered 2020-01-23: 14000 [IU] via INTRAVENOUS

## 2020-01-23 MED ORDER — METOPROLOL TARTRATE 5 MG/5ML IV SOLN
10.0000 mg | Freq: Once | INTRAVENOUS | Status: AC
  Administered 2020-01-23: 10 mg via INTRAVENOUS

## 2020-01-23 MED ORDER — FENTANYL CITRATE (PF) 100 MCG/2ML IJ SOLN: 25.0000 ug | INTRAMUSCULAR | Status: DC | PRN

## 2020-01-23 MED ORDER — SUGAMMADEX SODIUM 200 MG/2ML IV SOLN
INTRAVENOUS | Status: DC | PRN
  Administered 2020-01-23: 200 mg via INTRAVENOUS

## 2020-01-23 MED ORDER — FENTANYL CITRATE (PF) 100 MCG/2ML IJ SOLN
INTRAMUSCULAR | Status: AC
  Filled 2020-01-23: qty 2

## 2020-01-23 MED ORDER — HYDROCODONE-ACETAMINOPHEN 5-325 MG PO TABS
1.0000 | ORAL_TABLET | ORAL | Status: DC | PRN
  Administered 2020-01-23 (×2): 2 via ORAL

## 2020-01-23 MED ORDER — HYDROCODONE-ACETAMINOPHEN 5-325 MG PO TABS
ORAL_TABLET | ORAL | Status: AC
  Filled 2020-01-23: qty 2

## 2020-01-23 MED ORDER — DILTIAZEM HCL ER COATED BEADS 120 MG PO CP24
120.0000 mg | ORAL_CAPSULE | Freq: Every day | ORAL | Status: DC
  Administered 2020-01-23 – 2020-01-24 (×2): 120 mg via ORAL
  Filled 2020-01-23 (×2): qty 1

## 2020-01-23 MED ORDER — RIVAROXABAN 20 MG PO TABS
20.0000 mg | ORAL_TABLET | Freq: Once | ORAL | Status: AC
  Administered 2020-01-23: 20 mg via ORAL
  Filled 2020-01-23: qty 1

## 2020-01-23 MED ORDER — METOPROLOL TARTRATE 5 MG/5ML IV SOLN
INTRAVENOUS | Status: AC
  Filled 2020-01-23: qty 10

## 2020-01-23 MED ORDER — ROCURONIUM BROMIDE 10 MG/ML (PF) SYRINGE
PREFILLED_SYRINGE | INTRAVENOUS | Status: DC | PRN
  Administered 2020-01-23 (×2): 20 mg via INTRAVENOUS
  Administered 2020-01-23: 50 mg via INTRAVENOUS
  Administered 2020-01-23: 10 mg via INTRAVENOUS

## 2020-01-23 MED ORDER — PANTOPRAZOLE SODIUM 40 MG PO TBEC: 40.0000 mg | DELAYED_RELEASE_TABLET | Freq: Every day | ORAL | 0 refills | Status: DC

## 2020-01-23 MED ORDER — HEPARIN SODIUM (PORCINE) 1000 UNIT/ML IJ SOLN
INTRAMUSCULAR | Status: DC | PRN
  Administered 2020-01-23 (×3): 5000 [IU] via INTRAVENOUS

## 2020-01-23 MED ORDER — ALPRAZOLAM 0.25 MG PO TABS: 0.2500 mg | ORAL_TABLET | Freq: Two times a day (BID) | ORAL | Status: DC | PRN

## 2020-01-23 MED ORDER — SODIUM CHLORIDE 0.9% FLUSH
3.0000 mL | Freq: Two times a day (BID) | INTRAVENOUS | Status: DC
  Administered 2020-01-23: 3 mL via INTRAVENOUS

## 2020-01-23 SURGICAL SUPPLY — 20 items
BLANKET WARM UNDERBOD FULL ACC (MISCELLANEOUS) ×2 IMPLANT
CATH 8FR REPROCESSED SOUNDSTAR (CATHETERS) ×2 IMPLANT
CATH MAPPNG PENTARAY F 2-6-2MM (CATHETERS) ×1 IMPLANT
CATH SMTCH THERMOCOOL SF DF (CATHETERS) ×2 IMPLANT
CATH WEBSTER BI DIR CS D-F CRV (CATHETERS) ×2 IMPLANT
COVER SWIFTLINK CONNECTOR (BAG) ×2 IMPLANT
DEVICE CLOSURE PERCLS PRGLD 6F (VASCULAR PRODUCTS) ×3 IMPLANT
MAT PREVALON FULL STRYKER (MISCELLANEOUS) ×2 IMPLANT
NEEDLE BAYLIS TRANSSEPTAL 71CM (NEEDLE) ×2 IMPLANT
PACK EP LATEX FREE (CUSTOM PROCEDURE TRAY) ×1
PACK EP LF (CUSTOM PROCEDURE TRAY) ×1 IMPLANT
PAD PRO RADIOLUCENT 2001M-C (PAD) ×2 IMPLANT
PATCH CARTO3 (PAD) ×2 IMPLANT
PENTARAY F 2-6-2MM (CATHETERS) ×2
PERCLOSE PROGLIDE 6F (VASCULAR PRODUCTS) ×6
SHEATH PINNACLE 7F 10CM (SHEATH) ×4 IMPLANT
SHEATH PINNACLE 9F 10CM (SHEATH) ×2 IMPLANT
SHEATH PROBE COVER 6X72 (BAG) ×2 IMPLANT
SHEATH SWARTZ TS SL2 63CM 8.5F (SHEATH) ×2 IMPLANT
TUBING SMART ABLATE COOLFLOW (TUBING) ×2 IMPLANT

## 2020-01-23 NOTE — Transfer of Care (Signed)
Immediate Anesthesia Transfer of Care Note  Patient: Dylan Bennett  Procedure(s) Performed: ATRIAL FIBRILLATION ABLATION (N/A )  Patient Location: Cath Lab  Anesthesia Type:General  Level of Consciousness: awake and alert   Airway & Oxygen Therapy: Patient Spontanous Breathing and Patient connected to nasal cannula oxygen  Post-op Assessment: Report given to RN and Post -op Vital signs reviewed and stable  Post vital signs: Reviewed and stable  Last Vitals:  Vitals Value Taken Time  BP 116/80 01/23/20 1030  Temp    Pulse 78 01/23/20 1031  Resp 13 01/23/20 1031  SpO2 100 % 01/23/20 1031  Vitals shown include unvalidated device data.  Last Pain:  Vitals:   01/23/20 0604  TempSrc:   PainSc: 0-No pain         Complications: No complications documented.

## 2020-01-23 NOTE — Progress Notes (Addendum)
Rebleeding noted from top stick, femstop reinflated to 93 mmHg.  MD notified and will be in to assess patient.

## 2020-01-23 NOTE — H&P (Signed)
Dylan Bennett is a 44 y.o. male who presents today for afib ablation.  Since last being seen in our clinic, the patient reports doing very well.  Today, he denies symptoms of palpitations, chest pain, shortness of breath,  lower extremity edema, dizziness, presyncope, or syncope.  he has had some R groin pain for which he recently had an evaluation and is found to have a small hernia.  The patient is otherwise without complaint today.       Past Medical History:  Diagnosis Date  . Atrial fibrillation ([place]) 05/2019  . Colorectal cancer ([place]) 2001   treated with colon resection and permanent coloscopy.  He also had radiation and chemotherapy  . OSA (obstructive sleep apnea)         Past Surgical History:  Procedure Laterality Date  . ABDOMINAL PERINEAL BOWEL RESECTION    . CARDIOVERSION N/A 09/29/2019   Procedure: CARDIOVERSION;  Surgeon: Nahser, Wonda Cheng, MD;  Location: [facility] ENDOSCOPY;  Service: Cardiovascular;  Laterality: N/A;  . PORTACATH PLACEMENT  2001    ROS- all systems are reviewed and negatives except as per HPI above        Current Outpatient Medications  Medication Sig Dispense Refill  . amphetamine-dextroamphetamine (ADDERALL XR) 25 MG 24 hr capsule Take 25 mg by mouth every morning.    . diltiazem (CARDIZEM CD) 120 MG 24 hr capsule Take 1 capsule (120 mg total) by mouth in the morning and at bedtime. 60 capsule 3  . rivaroxaban (XARELTO) 20 MG TABS tablet Take 1 tablet (20 mg total) by mouth daily with supper. 30 tablet 3  . sertraline (ZOLOFT) 50 MG tablet Take 50 mg by mouth daily.    . sildenafil (VIAGRA) 100 MG tablet Take 50-100 mg by mouth daily as needed for erectile dysfunction.     No current facility-administered medications for this visit.    Physical Exam:  Today's Vitals   01/23/20 0547 01/23/20 0604  BP: 136/90   Pulse: 77   Resp: 17   Temp: (!) 97.5 F (36.4 C)   TempSrc: Oral   SpO2: 100%   Weight: 102.1 kg   Height: 6\' 4"   (1.93 m)   PainSc:  0-No pain   Body mass index is 27.39 kg/m.  GEN- The patient is well appearing, alert and oriented x 3 today.   Head- normocephalic, atraumatic Eyes-  Sclera clear, conjunctiva pink Ears- hearing intact Oropharynx- clear Lungs-   normal work of breathing Heart- tachycardic irregular rhythm, GI- soft  Extremities- no clubbing, cyanosis, or edema     Wt Readings from Last 3 Encounters:  12/21/19 226 lb (102.5 kg)  10/30/19 224 lb (101.6 kg)  10/05/19 224 lb (101.6 kg)   Assessment and Plan:  1. persistent afib The patient has symptomatic, recurrent atrial fibrillation. V rates are elevated I worry about his risks of CHF he has failed medical therapy with flecainide. Chads2vasc score is 0.  He is on xarelto  Risk, benefits, and alternatives to EP study and radiofrequency ablation for afib were also discussed in detail today. These risks include but are not limited to stroke, bleeding, vascular damage, tamponade, perforation, damage to the esophagus, lungs, and other structures, pulmonary vein stenosis, worsening renal function, and death. The patient understands these risk and wishes to proceed.   Cardiac CT reviewed at length.  I do not feel that he has a LAA thrombus. He reports compliance with xarelto without interruption.  [name] MD, [facility] 01/23/2020 7:26 AM

## 2020-01-23 NOTE — Progress Notes (Signed)
Femstop removed at 1740 by Raoul Pitch. Slow ooze, 20 minute manual hold until 1800. Very small, slow ooze. Dr. Rayann Heman in to see. Level 0. Rt groin dressed w/gauze and tegaderm. Bedrest starts at 1815. Instructions reviewed w/patient.

## 2020-01-23 NOTE — Progress Notes (Signed)
Dr. Rayann Heman made aware of continued bleeding rt groin sites, requiring constant manual hold. No hematoma. Rt groin site extremely tender.

## 2020-01-23 NOTE — Progress Notes (Signed)
Order obtained from Dr. Rayann Heman for femstop. Femstop applied to rt groin, 95mmHg in bulb. Hemostasis obtained rt groin. Rt dp 1+ palpable. C/O 4/10 left sided back discomfort. Rt groin 4/10 pressure like discomfort.

## 2020-01-23 NOTE — Anesthesia Postprocedure Evaluation (Signed)
Anesthesia Post Note  Patient: Dylan Bennett  Procedure(s) Performed: ATRIAL FIBRILLATION ABLATION (N/A )     Patient location during evaluation: PACU Anesthesia Type: General Level of consciousness: awake Pain management: pain level controlled Vital Signs Assessment: post-procedure vital signs reviewed and stable Respiratory status: spontaneous breathing Cardiovascular status: stable Postop Assessment: no apparent nausea or vomiting Anesthetic complications: no   No complications documented.  Last Vitals:  Vitals:   01/23/20 1500 01/23/20 1505  BP:    Pulse: 88 89  Resp: 13 15  Temp:    SpO2: 97% 98%    Last Pain:  Vitals:   01/23/20 1422  TempSrc:   PainSc: 5                  [place]

## 2020-01-23 NOTE — Anesthesia Procedure Notes (Signed)
Procedure Name: Intubation Date/Time: 01/23/2020 7:42 AM Performed by: Wilburn Cornelia, CRNA Pre-anesthesia Checklist: Patient identified, Emergency Drugs available, Suction available, Patient being monitored and Timeout performed Patient Re-evaluated:Patient Re-evaluated prior to induction Oxygen Delivery Method: Circle system utilized Preoxygenation: Pre-oxygenation with 100% oxygen Induction Type: IV induction Ventilation: Mask ventilation without difficulty Laryngoscope Size: Mac and 4 Grade View: Grade II Tube type: Oral Tube size: 7.5 mm Number of attempts: 1 Airway Equipment and Method: Stylet Placement Confirmation: ETT inserted through vocal cords under direct vision,  positive ETCO2,  CO2 detector and breath sounds checked- equal and bilateral Secured at: 24 cm Tube secured with: Tape Dental Injury: Teeth and Oropharynx as per pre-operative assessment

## 2020-01-23 NOTE — Progress Notes (Signed)
Femstop deflated, rebleeding from top stick noted.  Femstop repositioned and inflated to 17 mmHg.  No current bleeding noted, patient comfortable.  Will continue to monitor.

## 2020-01-24 DIAGNOSIS — Z85038 Personal history of other malignant neoplasm of large intestine: Secondary | ICD-10-CM | POA: Diagnosis not present

## 2020-01-24 DIAGNOSIS — I4819 Other persistent atrial fibrillation: Secondary | ICD-10-CM | POA: Diagnosis not present

## 2020-01-24 DIAGNOSIS — Z7901 Long term (current) use of anticoagulants: Secondary | ICD-10-CM | POA: Diagnosis not present

## 2020-01-24 DIAGNOSIS — G4733 Obstructive sleep apnea (adult) (pediatric): Secondary | ICD-10-CM | POA: Diagnosis not present

## 2020-01-24 MED ORDER — ACETAMINOPHEN 325 MG PO TABS: 650.0000 mg | ORAL_TABLET | ORAL | Status: AC | PRN

## 2020-01-24 NOTE — Discharge Summary (Signed)
ELECTROPHYSIOLOGY PROCEDURE DISCHARGE SUMMARY    Patient ID: Dylan Bennett,  MRN: [id number], DOB/AGE: 1975/05/13 44 y.o.  Admit date: 01/23/2020 Discharge date: 01/24/2020  Primary Care Physician: Pa, [place]  Primary Cardiologist: No primary care provider on file.  Electrophysiologist: Dr. Rayann Heman  Primary Discharge Diagnosis:  Persistent atrial fibrillation  Secondary Discharge Diagnosis:  OSA ETOH use  Procedures This Admission:  1.  Electrophysiology study and radiofrequency catheter ablation of Atrial Fibrillation on 01/23/2020 by Dr. Rayann Heman.  This study demonstrated; 1. Atrial fibrillation upon presentation.   2. Intracardiac echo reveals a moderate sized left atrium with four separate pulmonary veins without evidence of pulmonary vein stenosis. 3. Successful electrical isolation and anatomical encircling of all four pulmonary veins with radiofrequency current.  A WACA approach was used 3. Additional left atrial ablation was performed with a standard box lesion created along the posterior wall of the left atrium 4. Atrial fibrillation successfully cardioverted to sinus rhythm. 5. No early apparent complications.    Brief HPI: Dylan Bennett is a 44 y.o. male with a history of persistent Atrial Fibrillation.  They have failed medical therapy with flecainide. Risks, benefits, and alternatives to catheter ablation of Atrial Fibrillation were reviewed with the patient who wished to proceed.  The patient intracardiac echo during the procedure which demonstrated normal LV function and no LAA thrombus.    Hospital Course:  The patient was admitted and underwent EPS/RFCA of Atrial Fibrillation with details as outlined above.  They were monitored on telemetry overnight which demonstrated NSR.  Groin initially had prolonged oozing post op. But stabilized with femstop and pt observed overnight. Groin appeared stable on the day of discharge.  The patient was  examined and considered to be stable for discharge.  Wound care and restrictions were reviewed with the patient.  The patient will be seen back by Roderic Palau, NP in 4 weeks and Dr. Rayann Heman in 12 weeks for post ablation follow up.   This patients CHA2DS2-VASc Score and unadjusted Ischemic Stroke Rate (% per year) is equal to 0.2 % stroke rate/year from a score of 0 Above score calculated as 1 point each if present [CHF, HTN, DM, Vascular=MI/PAD/Aortic Plaque, Age if 65-74, or Male] Above score calculated as 2 points each if present [Age > 75, or Stroke/TIA/TE]    Physical Exam: Vitals:   01/23/20 2047 01/24/20 0019 01/24/20 0518 01/24/20 0811  BP: 119/80 114/63    Pulse:    80  Resp:    17  Temp: 97.6 F (36.4 C) 97.9 F (36.6 C) 98.1 F (36.7 C) 97.9 F (36.6 C)  TempSrc: Oral Oral Oral Oral  SpO2:    100%  Weight:      Height:        GEN- The patient is well appearing, alert and oriented x 3 today.   HEENT: normocephalic, atraumatic; sclera clear, conjunctiva pink; hearing intact; oropharynx clear; neck supple  Lungs- Clear to ausculation bilaterally, normal work of breathing.  No wheezes, rales, rhonchi Heart- Regular rate and rhythm, no murmurs, rubs or gallops  GI- soft, non-tender, non-distended, bowel sounds present  Extremities- no clubbing, cyanosis, or edema; DP/PT/radial pulses 2+ bilaterally, groin without hematoma/bruit MS- no significant deformity or atrophy Skin- warm and dry, no rash or lesion Psych- euthymic mood, full affect Neuro- strength and sensation are intact   Labs:   Lab Results  Component Value Date   WBC 6.0 01/05/2020   HGB 14.2 01/05/2020   HCT 42.1  01/05/2020   MCV 92 01/05/2020   PLT 145 (L) 01/05/2020   No results for input(s): NA, K, CL, CO2, BUN, CREATININE, CALCIUM, PROT, BILITOT, ALKPHOS, ALT, AST, GLUCOSE in the last 168 hours.  Invalid input(s): LABALBU   Discharge Medications:  Allergies as of 01/24/2020      Reactions    Penicillins Rash   20 years ago      Medication List    STOP taking these medications   metoprolol tartrate 100 MG tablet Commonly known as: LOPRESSOR     TAKE these medications   acetaminophen 325 MG tablet Commonly known as: TYLENOL Take 2 tablets (650 mg total) by mouth every 4 (four) hours as needed for headache or mild pain.   diltiazem 120 MG 24 hr capsule Commonly known as: CARDIZEM CD Take 2 tablets in the AM and 1 tablet in the PM What changed:   how much to take  how to take this  when to take this  additional instructions   multivitamin with minerals tablet Take 1 tablet by mouth daily.   pantoprazole 40 MG tablet Commonly known as: Protonix Take 1 tablet (40 mg total) by mouth daily.   sertraline 50 MG tablet Commonly known as: ZOLOFT Take 50 mg by mouth daily.   sildenafil 100 MG tablet Commonly known as: VIAGRA Take 50-100 mg by mouth daily as needed for erectile dysfunction.   Xarelto 20 MG Tabs tablet Generic drug: rivaroxaban TAKE 1 TABLET (20 MG TOTAL) BY MOUTH DAILY WITH SUPPER. What changed: See the new instructions.       Disposition:    Follow-up Information    [name], MD Follow up on 05/24/2020.   Specialty: Cardiology Why: at 1145 for 3 month post ablation follow up . Contact information: [address] [place] [address] [place] Follow up on 02/29/2020.   Specialty: Cardiology Why: at 330 for 4 week post ablation follow up Contact information: [address] [id number] [place] [phone]              Duration of Discharge Encounter: Greater than 30 minutes including physician time.  Jacalyn Lefevre, PA-C  01/24/2020 8:42 AM

## 2020-01-24 NOTE — Discharge Instructions (Signed)

## 2020-01-25 MED FILL — Heparin Sod (Porcine)-NaCl IV Soln 1000 Unit/500ML-0.9%: INTRAVENOUS | Qty: 500 | Status: AC

## 2020-01-26 ENCOUNTER — Telehealth (HOSPITAL_COMMUNITY): Payer: Self-pay | Admitting: *Deleted

## 2020-01-26 NOTE — Telephone Encounter (Signed)
0900 -- Pts wife had called In this morning stating patient was feeling increased shortness of breath today. Wife was unsure if had swelling or weight gain but stated he was having some shortness of breath with exertion and had difficulty sleeping during night.  Offered to bring patient in for appt or discuss possible small dose of lasix for shortness of breath - wife stated she would discuss with patient and if he consented to either she would call me back.  ER precautions were reviewed as well for over weekend.  1430 no return call from wife or patient at this time.

## 2020-02-16 ENCOUNTER — Other Ambulatory Visit: Payer: Self-pay | Admitting: Internal Medicine

## 2020-02-29 ENCOUNTER — Other Ambulatory Visit: Payer: Self-pay

## 2020-02-29 ENCOUNTER — Ambulatory Visit (HOSPITAL_COMMUNITY)
Admission: RE | Admit: 2020-02-29 | Discharge: 2020-02-29 | Disposition: A | Discharge status: Home / Self Care | Payer: 59 | Source: Ambulatory Visit | Attending: Nurse Practitioner | Admitting: Nurse Practitioner

## 2020-02-29 ENCOUNTER — Encounter (HOSPITAL_COMMUNITY): Payer: Self-pay | Admitting: Nurse Practitioner

## 2020-02-29 VITALS — BP 148/86 | HR 75 | Ht 76.0 in | Wt 227.6 lb

## 2020-02-29 DIAGNOSIS — D6869 Other thrombophilia: Secondary | ICD-10-CM | POA: Diagnosis not present

## 2020-02-29 DIAGNOSIS — Z7289 Other problems related to lifestyle: Secondary | ICD-10-CM | POA: Insufficient documentation

## 2020-02-29 DIAGNOSIS — G4733 Obstructive sleep apnea (adult) (pediatric): Secondary | ICD-10-CM | POA: Diagnosis not present

## 2020-02-29 DIAGNOSIS — R202 Paresthesia of skin: Secondary | ICD-10-CM | POA: Diagnosis not present

## 2020-02-29 DIAGNOSIS — Z87891 Personal history of nicotine dependence: Secondary | ICD-10-CM | POA: Insufficient documentation

## 2020-02-29 DIAGNOSIS — I4819 Other persistent atrial fibrillation: Secondary | ICD-10-CM | POA: Diagnosis present

## 2020-02-29 DIAGNOSIS — Z79899 Other long term (current) drug therapy: Secondary | ICD-10-CM | POA: Diagnosis not present

## 2020-02-29 NOTE — Progress Notes (Signed)
Primary Care Physician: [facility] Referring Physician: Dr. Lamount Cohen Cardiologist: Dr. Kavin Leech Dylan Bennett is a 44 y.o. male with a h/o paroxymal afib that is here f/u from consult with Dr.Allred 06/26/19, for 33 % burden on monitor,with very elevated V rates and a regular tachycardia at 280 bpm.  He recommended ablation or flecainide if he tolerated the diltiazem that pt was placed on that visit. Pt did not want ablation at the time.   Today, he is in afib with v rate mid 120's. He is unaware. He has tolerated the diltiazem.  He obtained the home sleep study but he has not done this yet. Wife states witnessed snoring and apnea. He is not drinking excessive caffeine, but does drink 6+ beers a week maybe more on the weekend. Dr. Rayann Heman recommended getting a apple watch, KardiaMobile to track degree of afib, but he did not feel compelled to purchase. CHA2DS2VASc sore of 0. It was discussed to start flecainide 50 mg bid.   P\t is now back in afib clinc, 4/26, and despite staring on flecainide 50 mg bid he persists in afib. I fear that he has progressed from paroxysmal tp persistent, as he has been in afib for 3 visits now. I will start on xarelto 20 mg qd as I may have to cardiovert and will not increase flecainide to 100 mg bid until I get on anticoagulation. No bleeding history. BP is also increased and his HR is around 120 mg bpm.   F/u in afib clinic, 5/13. He will have been on anticoagulation 21 days on May 17th  and will be eligible for cardioversion after 5/17, he has not missed any anticoagulation since start. He recently had a sleep study, results are not in [place] yet but he was told he had severe sleep apnea. Received a call from [facility] today but was not able to answer. He feels this was probably  related to obtaining cpap. No issues with xarelto.   F/u in afib clinic, 6/3. Pt had successful cardioversion but unfortunately is back in afib with RVR. He did not  notice any difference in the way he felt after cardioversion.  He is unaware of the afib today. He is very upset that his afib is not responding to the treatment plan. He is also very upset that he has not been contacted to have cpap titration ordered for a positive  sleep study at which time someone called and said he had severesleep apnea but the final report is not in Epic. He also c/o of tingling in his left arm that was present since last fall, preceded the afib, that is there almost all the time,present now , not tied to activity,  that affects his left thumb thru middle finger and radiates to his inner arm up to his axilla. I have asked him to have this further evaluated with his PCP but he has not done this yet.   F/u in afib clinic 10/28, as pt had an ablation one month ago. He is in SR, he was unaware.  He did have a groin hematoma which has resolved. He does not feel any different in SR and he did in afib. His wife got a new job in [place], [place], and they will be moving 11/22.   Today, he denies symptoms of palpitations, chest pain, shortness of breath, orthopnea, PND, lower extremity edema, dizziness, presyncope, syncope, or neurologic sequela. The patient is tolerating medications without difficulties and is  otherwise without complaint today.   Past Medical History:  Diagnosis Date  . Atrial fibrillation ([place]) 05/2019  . Colorectal cancer ([place]) 2001   treated with colon resection and permanent coloscopy.  He also had radiation and chemotherapy  . OSA (obstructive sleep apnea)    Past Surgical History:  Procedure Laterality Date  . ABDOMINAL PERINEAL BOWEL RESECTION    . ATRIAL FIBRILLATION ABLATION N/A 01/23/2020   Procedure: ATRIAL FIBRILLATION ABLATION;  Surgeon: [name], MD;  Location: [facility];  Service: Cardiovascular;  Laterality: N/A;  . CARDIOVERSION N/A 09/29/2019   Procedure: CARDIOVERSION;  Surgeon: Thayer Headings, MD;  Location: [facility];   Service: Cardiovascular;  Laterality: N/A;  . PORTACATH PLACEMENT  2001    Current Outpatient Medications  Medication Sig Dispense Refill  . acetaminophen (TYLENOL) 325 MG tablet Take 2 tablets (650 mg total) by mouth every 4 (four) hours as needed for headache or mild pain.    Marland Kitchen diltiazem (CARDIZEM CD) 120 MG 24 hr capsule Take 2 tablets in the AM and 1 tablet in the PM (Patient taking differently: Take 120 mg by mouth See admin instructions. Take 240 mg tablets in the AM and 120 mg tablet in the PM) 270 capsule 3  . Multiple Vitamins-Minerals (MULTIVITAMIN WITH MINERALS) tablet Take 1 tablet by mouth daily.    . pantoprazole (PROTONIX) 40 MG tablet Take 1 tablet (40 mg total) by mouth daily. 45 tablet 0  . sertraline (ZOLOFT) 50 MG tablet Take 50 mg by mouth daily.    . sildenafil (VIAGRA) 100 MG tablet Take 50-100 mg by mouth daily as needed for erectile dysfunction.     Alveda Reasons 20 MG TABS tablet TAKE 1 TABLET (20 MG TOTAL) BY MOUTH DAILY WITH SUPPER. (Patient taking differently: Take 20 mg by mouth daily with supper. ) 30 tablet 11   No current facility-administered medications for this encounter.    Allergies  Allergen Reactions  . Penicillins Rash    20 years ago    Social History   Socioeconomic History  . Marital status: Married    Spouse name: Not on file  . Number of children: Not on file  . Years of education: Not on file  . Highest education level: Not on file  Occupational History  . Not on file  Tobacco Use  . Smoking status: Former Research scientist (life sciences)  . Smokeless tobacco: Never Used  Substance and Sexual Activity  . Alcohol use: Yes    Comment: none during the weekn, 6-12 beers on the weekend  . Drug use: Never  . Sexual activity: Not on file  Other Topics Concern  . 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 Difficulty of Paying Living Expenses: Not on file  Food Insecurity:   . Worried About Charity fundraiser in the Last Year: Not on file  . Ran Out of Food in the Last Year: Not on file  Transportation Needs:   . Lack of Transportation (Medical): Not on file  . Lack of Transportation (Non-Medical): Not on file  Physical Activity:   . Days of Exercise per Week: Not on file  . Minutes of Exercise per Session: Not on file  Stress:   . Feeling of Stress :  Not on file  Social Connections:   . Frequency of Communication with Friends and Family: Not on file  . Frequency of Social Gatherings with Friends and Family: Not on file  . Attends Religious Services: Not on file  . Active Member of Clubs or Organizations: Not on file  . Attends Archivist Meetings: Not on file  . Marital Status: Not on file  Intimate Partner Violence:   . Fear of Current or Ex-Partner: Not on file  . Emotionally Abused: Not on file  . Physically Abused: Not on file  . Sexually Abused: Not on file    Family History  Problem Relation Age of Onset  . Hypotension Mother   . Heart attack Father     ROS- All systems are reviewed and negative except as per the HPI above  Physical Exam: Vitals:   02/29/20 1520  BP: (!) 148/86  Pulse: 75  Weight: 103.2 kg  Height: 6\' 4"  (1.93 m)   Wt Readings from Last 3 Encounters:  02/29/20 103.2 kg  01/23/20 102.1 kg  12/21/19 102.5 kg    Labs: Lab Results  Component Value Date   NA 144 01/05/2020   K 4.5 01/05/2020   CL 108 (H) 01/05/2020   CO2 25 01/05/2020   GLUCOSE 69 01/05/2020   BUN 23 01/05/2020   CREATININE 1.36 (H) 01/05/2020   CALCIUM 9.2 01/05/2020   No results found for: INR No results found for: CHOL, HDL, LDLCALC, TRIG   GEN- The patient is well appearing, alert and oriented x 3 today.   Head- normocephalic, atraumatic Eyes-  Sclera clear, conjunctiva pink Ears- hearing intact Oropharynx-  clear Neck- supple, no JVP Lymph- no cervical lymphadenopathy Lungs- Clear to ausculation bilaterally, normal work of breathing Heart -regular rate and rhythm, no murmurs, rubs or gallops, PMI not laterally displaced GI- soft, NT, ND, + BS Extremities- no clubbing, cyanosis, or edema MS- no significant deformity or atrophy Skin- no rash or lesion Psych- euthymic mood, full affect Neuro- strength and sensation are intact  EKG-sinus rhythm at 75 bpm, pr int 160 ms, qrs int 92 ms, qtc 448 ms  Echo-1. Left ventricular ejection fraction, by visual estimation, is 50 to  55%. The left ventricle has low normal function. There is no left  ventricular hypertrophy. GLS -18.4%.  2. Left ventricular diastolic parameters are consistent with Grade I  diastolic dysfunction (impaired relaxation).  3. Mildly dilated left ventricular internal cavity size.  4. The left ventricle has no regional wall motion abnormalities.  5. Global right ventricle has normal systolic function.The right  ventricular size is normal. No increase in right ventricular wall  thickness.  6. Left atrial size was normal.  7. Right atrial size was normal.  8. The mitral valve is normal in structure. Trivial mitral valve  regurgitation. No evidence of mitral stenosis.  9. The tricuspid valve is normal in structure.  10. The aortic valve is tricuspid. Aortic valve regurgitation is not  visualized. No evidence of aortic valve sclerosis or stenosis.  11. There is mild dilatation of the ascending aorta measuring 40 mm.  12. The tricuspid regurgitant velocity is 2.09 m/s, and with an assumed  right atrial pressure of 3 mmHg, the estimated right ventricular systolic  pressure is normal at 20.5 mmHg.  13. The inferior vena cava is normal in size with greater than 50%  respiratory variability, suggesting right atrial pressure of 3 mmHg.    Assessment and Plan: 1. Persistent  afib  Previous flecainide and cardioversion was  not successful  He is now one month s/p ablation and is in SR  He feels no different in SR CHA2DS2VASc score of 0 Continue  on Xarelto 20 mg qd  for the 3 month recovery period   2. Lifestyle issues  Limit alcohol  Regular exercise encouraged  I have enocuraged a means to track afib since he is not aware when he is in it, Kardia mobile, apple watch or just a pulse ox  3. Severe sleep apnea  CPAP encouraged   Pt will be moving to [place], [place] 03/25/20, so his appointment with Dr. Rayann Heman will be moved up, he is wanting the name of  a medical center or EP group to establish with there  I will request an appointment with Dr. Lawrence Marseilles C. Deontaye Civello, [facility] [address] [place], [place] [address] [phone]

## 2020-02-29 NOTE — Patient Instructions (Signed)
Kardia mobile  Apple watch  Pulse ox

## 2020-03-20 ENCOUNTER — Ambulatory Visit: Payer: 59 | Admitting: Internal Medicine

## 2020-03-20 ENCOUNTER — Encounter: Payer: Self-pay | Admitting: Internal Medicine

## 2020-03-20 ENCOUNTER — Other Ambulatory Visit: Payer: Self-pay

## 2020-03-20 VITALS — BP 140/70 | HR 72 | Ht 76.0 in | Wt 230.0 lb

## 2020-03-20 DIAGNOSIS — G4733 Obstructive sleep apnea (adult) (pediatric): Secondary | ICD-10-CM

## 2020-03-20 DIAGNOSIS — I4819 Other persistent atrial fibrillation: Secondary | ICD-10-CM | POA: Diagnosis not present

## 2020-03-20 MED ORDER — DILTIAZEM HCL ER COATED BEADS 120 MG PO CP24: 120.0000 mg | ORAL_CAPSULE | Freq: Every day | ORAL | 3 refills | Status: AC

## 2020-03-20 NOTE — Progress Notes (Signed)
PCP: [facility] Primary Cardiologist: Dr Dylan Bennett  Dylan Bennett is a 44 y.o. male who presents today for routine electrophysiology followup.  Since his recent afib ablation, the patient reports doing very well.  he denies procedure related complications and is pleased with the results of the procedure. He did have a hematoma early which resolved. Today, he denies symptoms of palpitations, chest pain, shortness of breath,  lower extremity edema, dizziness, presyncope, or syncope.  The patient is otherwise without complaint today.   Past Medical History:  Diagnosis Date  . Atrial fibrillation ([place]) 05/2019  . Colorectal cancer ([place]) 2001   treated with colon resection and permanent coloscopy.  He also had radiation and chemotherapy  . OSA (obstructive sleep apnea)    Past Surgical History:  Procedure Laterality Date  . ABDOMINAL PERINEAL BOWEL RESECTION    . ATRIAL FIBRILLATION ABLATION N/A 01/23/2020   Procedure: ATRIAL FIBRILLATION ABLATION;  Surgeon: Dylan Grayer, MD;  Location: [facility];  Service: Cardiovascular;  Laterality: N/A;  . CARDIOVERSION N/A 09/29/2019   Procedure: CARDIOVERSION;  Surgeon: Dylan Fredrickson Wonda Cheng, MD;  [facility] ENDOS[facility];  Service: Cardiovascular;  Laterality: N/A;  . PORTACATH PLACEMENT  2001    ROS- all systems are personally reviewed and negatives except as per HPI above  Current Outpatient Medications  Medication Sig Dispense Refill  . acetaminophen (TYLENOL) 325 MG tablet Take 2 tablets (650 mg total) by mouth every 4 (four) hours as needed for headache or mild pain.    Marland Kitchen diltiazem (CARDIZEM CD) 120 MG 24 hr capsule Take 2 tablets in the AM and 1 tablet in the PM (Patient taking differently: Take 120 mg by mouth See admin instructions. Take 240 mg tablets in the AM and 120 mg tablet in the PM) 270 capsule 3  . Multiple Vitamins-Minerals (MULTIVITAMIN WITH MINERALS) tablet Take 1 tablet by mouth daily.    . sertraline  (ZOLOFT) 50 MG tablet Take 50 mg by mouth daily.    . sildenafil (VIAGRA) 100 MG tablet Take 50-100 mg by mouth daily as needed for erectile dysfunction.     Alveda Reasons 20 MG TABS tablet TAKE 1 TABLET (20 MG TOTAL) BY MOUTH DAILY WITH SUPPER. (Patient taking differently: Take 20 mg by mouth daily with supper. ) 30 tablet 11  . pantoprazole (PROTONIX) 40 MG tablet Take 1 tablet (40 mg total) by mouth daily. 45 tablet 0   No current facility-administered medications for this visit.    Physical Exam: Vitals:   03/20/20 1120  BP: 140/70  Pulse: 72  SpO2: 97%  Weight: 230 lb (104.3 kg)  Height: 6\' 4"  (1.93 m)    GEN- The patient is well appearing, alert and oriented x 3 today.   Head- normocephalic, atraumatic Eyes-  Sclera clear, conjunctiva pink Ears- hearing intact Oropharynx- clear Lungs- Clear to ausculation bilaterally, normal work of breathing Heart- Regular rate and rhythm, no murmurs, rubs or gallops, PMI not laterally displaced GI- soft, NT, ND, + BS Extremities- no clubbing, cyanosis, or edema  EKG tracing ordered today is personally reviewed and shows sinus  Assessment and Plan:  1. Persistent atrial fibrillation Doing well s/p ablation chads2vasc score is 0 Stop xarelto 04/23/20 Reduce diltiazem CD to 120mg  daily  2. ETOH Avoidance encouraged  3. OSA He is not compliant with CPAP I have advised compliance today   Return to see me in 3 months (virtually) He is moving to [facility] and may obtain an  EP physician there in the interim.  Dylan Grayer MD, [facility] 03/20/2020 11:27 AM

## 2020-03-20 NOTE — Patient Instructions (Signed)
Medication Instructions:  Stop Protonix Stop Xarelto on 04/23/20 Decrease your diltiazem to 120 mg daily  *If you need a refill on your cardiac medications before your next appointment, please call your pharmacy*  Lab Work: None ordered.  If you have labs (blood work) drawn today and your tests are completely normal, you will receive your results only by: Marland Kitchen MyChart Message (if you have MyChart) OR . A paper copy in the mail If you have any lab test that is abnormal or we need to change your treatment, we will call you to review the results.  Testing/Procedures: None ordered.  Follow-Up: At [facility], you and your health needs are our priority.  As part of our continuing mission to provide you with exceptional heart care, we have created designated Provider Care Teams.  These Care Teams include your primary Cardiologist (physician) and Advanced Practice Providers (APPs -  Physician Assistants and Nurse Practitioners) who all work together to provide you with the care you need, when you need it.  We recommend signing up for the patient portal called "MyChart".  Sign up information is provided on this After Visit Summary.  MyChart is used to connect with patients for Virtual Visits (Telemedicine).  Patients are able to view lab/test results, encounter notes, upcoming appointments, etc.  Non-urgent messages can be sent to your provider as well.   To learn more about what you can do with MyChart, go to [email or web address].    Your next appointment:   Your physician wants you to follow-up in: 06-21-20 at 11:45 am with Dr. Rayann Heman. This is a virtual visit.     Other Instructions:

## 2020-05-24 ENCOUNTER — Ambulatory Visit: Payer: 59 | Admitting: Internal Medicine

## 2020-05-25 ENCOUNTER — Other Ambulatory Visit: Payer: Self-pay | Admitting: Internal Medicine

## 2020-05-27 NOTE — Telephone Encounter (Signed)
Dr. Rayann Heman prescribe pantoprazole in the hospital for the pt. Would Dr. Rayann Heman like to refill this medication? Please address

## 2020-06-21 ENCOUNTER — Telehealth: Payer: 59 | Admitting: Internal Medicine

## 2020-06-21 ENCOUNTER — Other Ambulatory Visit: Payer: Self-pay

## 2020-07-04 ENCOUNTER — Other Ambulatory Visit: Payer: Self-pay | Admitting: Internal Medicine

## 2020-07-04 NOTE — Telephone Encounter (Signed)
Pt's pharmacy is requesting a refill on pantoprazole. If pt is not supposed to still be taking this medication, can it be taken off of pt's medication list? Please address

## 2020-07-08 ENCOUNTER — Encounter: Payer: Self-pay | Admitting: Internal Medicine

## 2020-08-01 ENCOUNTER — Telehealth: Payer: Self-pay | Admitting: Internal Medicine

## 2020-08-01 NOTE — Telephone Encounter (Signed)
New message   Pt now lives in [place]. He had a virtual visit with Dr. Rayann Heman and was unable to do a virtual on the day he was scheduled. Rescheduled pt for an office visit with Joseph Art on a day that he was going to be in town. Pt had to cancel because he is not able to make it to town. Pt wants to know if he can do a virtual visit with Dr. Rayann Heman [facility].   He said he has been in [place] since Dec and hasn't had time to find an EP MD down there.

## 2020-08-03 ENCOUNTER — Other Ambulatory Visit: Payer: Self-pay | Admitting: Internal Medicine

## 2020-08-09 ENCOUNTER — Ambulatory Visit: Payer: 59 | Admitting: Physician Assistant

## 2020-09-09 ENCOUNTER — Other Ambulatory Visit: Payer: Self-pay | Admitting: Internal Medicine

## 2020-10-07 ENCOUNTER — Other Ambulatory Visit: Payer: Self-pay | Admitting: Internal Medicine

## 2021-07-08 IMAGING — CT CT HEART MORPH/PULM VEIN W/ CM & W/O CA SCORE
2 of 8 series · 10 of 20 positions shown, 12 images · non-contrast
Comparison: None.
COMPARISON: None.

Addendum:
EXAM:
OVER-READ INTERPRETATION  CT CHEST

The following report is an over-read performed by radiologist Dr.
Md Sahel Md Murad [REDACTED] on 01/18/2020. This over-read
does not include interpretation of cardiac or coronary anatomy or
pathology. The coronary CTA interpretation by the cardiologist is
attached.
CLINICAL DATA: Atrial fibrillation
Cardiac CTA
MEDICATIONS:
No additional medications.
TECHNIQUE: The patient was scanned on a Siemens [REDACTED]ice scanner. Gantry
rotation speed was 250 msecs. Collimation was 0.6 mm. A 100 kV
prospective scan was triggered in the ascending thoracic aorta at
35-75% of the R-R interval. Average HR during the scan was 70 bpm.
The 3D data set was interpreted on a dedicated work station using
MPR, MIP and VRT modes. A total of 80cc of contrast was used.

[Series 9: 0-95% · axial · 0.39mm/px · z∈[+1253,+1367]mm · 5 of 3410 slices shown, 7 images]
[im 569/3410  vessel]
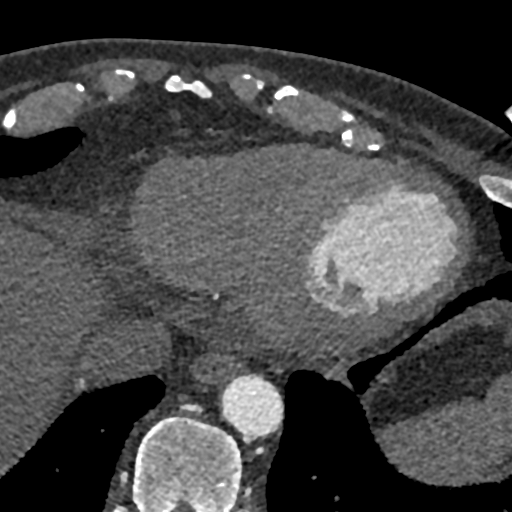
[im 569/3410  lung]
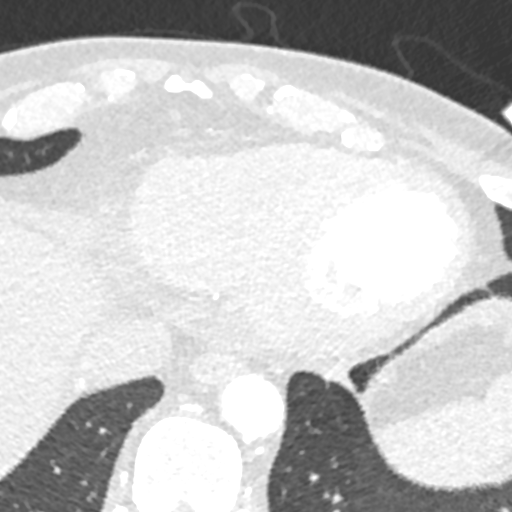
[im 1137/3410  vessel]
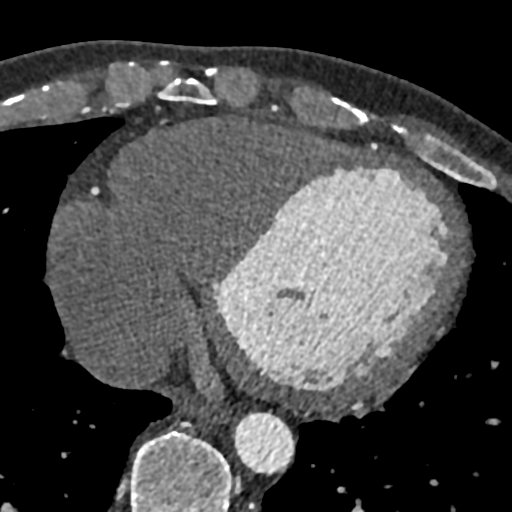
[im 1705/3410  vessel]
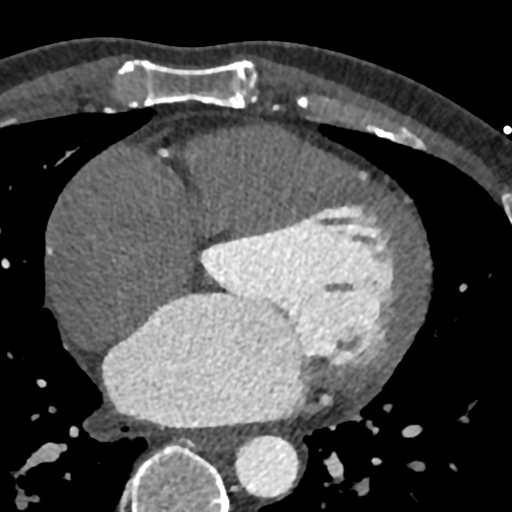
[im 2273/3410  vessel]
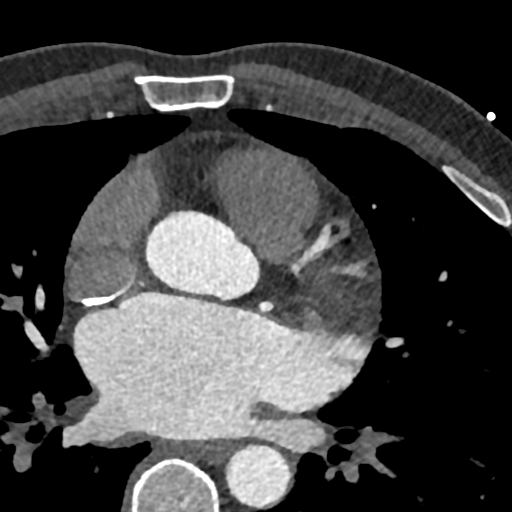
[im 2841/3410  vessel]
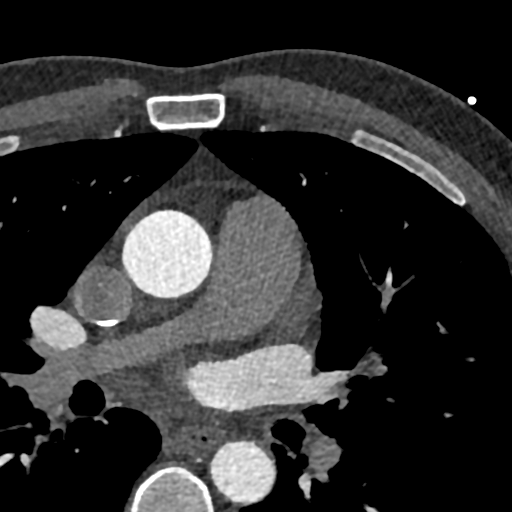
[im 2841/3410  lung]
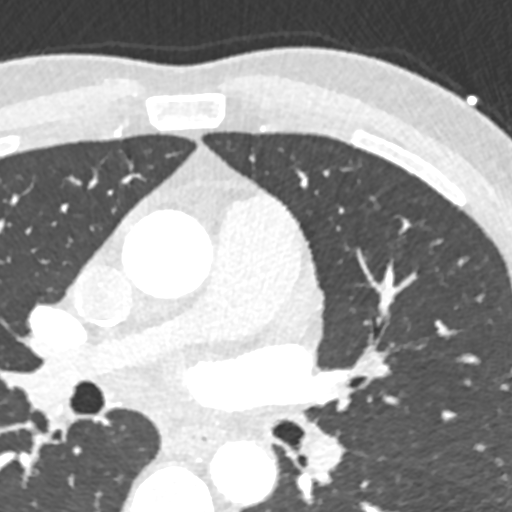

[Series 10: 5-95% · axial · 0.39mm/px · z∈[+1253,+1367]mm · 5 of 3410 slices shown]
[im 569/3410  vessel]
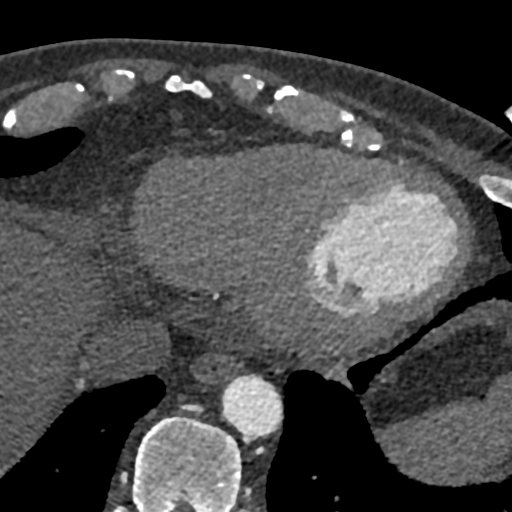
[im 1137/3410  vessel]
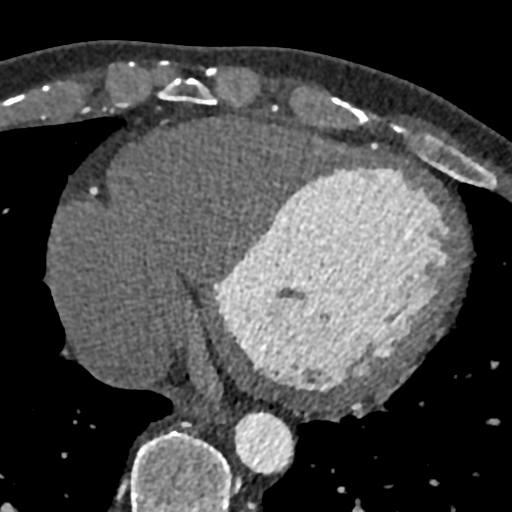
[im 1705/3410  vessel]
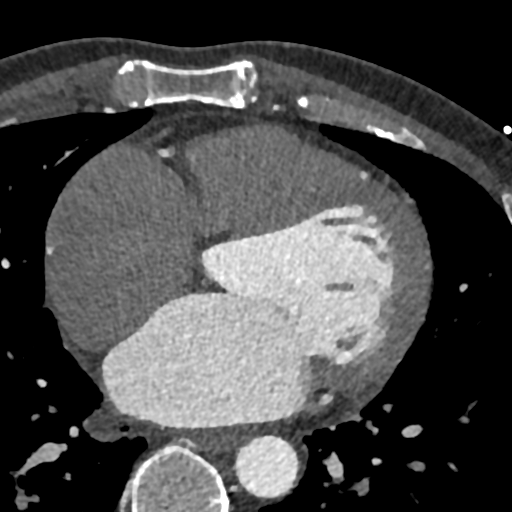
[im 2273/3410  vessel]
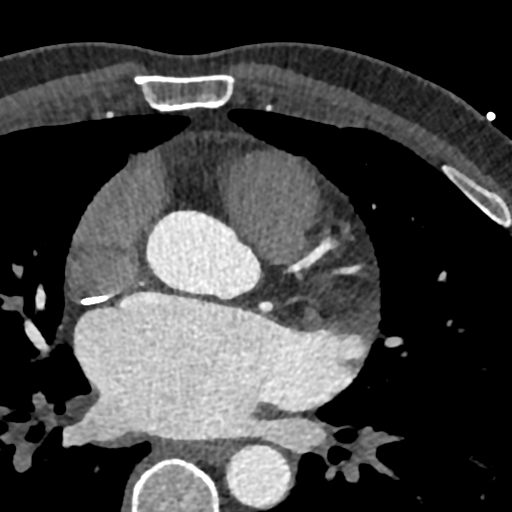
[im 2841/3410  vessel]
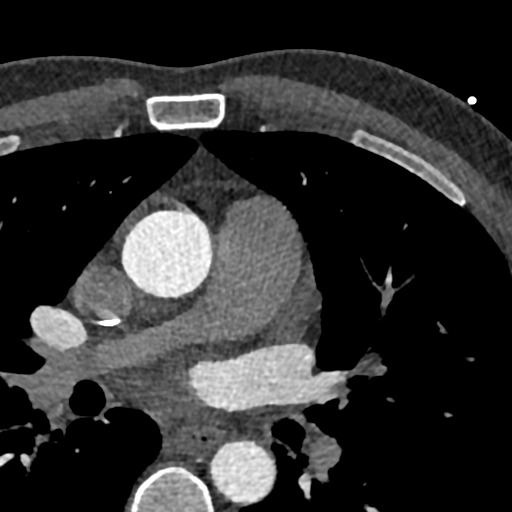

[10 of 20 positions shown; findings below may reference images not displayed]

FINDINGS: Vascular: Heart is normal size.  Aorta normal caliber.

Mediastinum/Nodes: No adenopathy

Lungs/Pleura: Visualized lungs clear.

Upper Abdomen: Visualized upper abdomen demonstrates no acute
findings.

Musculoskeletal: Chest wall soft tissues are unremarkable. No acute
bony abnormality.
IMPRESSION: No acute or significant extracardiac abnormality.
FINDINGS: Non-cardiac: See separate report from [REDACTED].

On the immediate contrasted images, the left atrial appendage does
not completely fill. No thrombus was detected on the delayed images.

Pulmonary veins drain normally to the left atrium.

LSPV 26.5 x 18.5 mm

LIPV 21 x 13 mm, close proximity to descending thoracic aorta.

RSPV 26 x 23 mm

RIPV 29 x 24 mm

Calcium Score: 0 Agatston units.

Coronary Arteries: Right dominant with no anomalies

LM: No plaque or stenosis.

LAD system: No plaque or stenosis.

Circumflex system: No plaque or stenosis.

RCA system: Motion artifact obscures mid RCA, but otherwise no
plaque or stenosis seen.
IMPRESSION: 1. On immediate contrast images, the LA appendage does not fill
completely. However, no thrombus noted on delayed contrast images.

2.  Pulmonary veins as noted above.

3. Coronary artery calcium score 0 Agatston units, suggesting low
risk for future cardiac events.

4. No significant coronary disease noted though mid RCA obscured by
motion artifact.

Oscar Abel Np

*** End of Addendum ***
EXAM:
OVER-READ INTERPRETATION  CT CHEST

The following report is an over-read performed by radiologist Dr.
Md Sahel Md Murad [REDACTED] on 01/18/2020. This over-read
does not include interpretation of cardiac or coronary anatomy or
pathology. The coronary CTA interpretation by the cardiologist is
attached.
FINDINGS: Vascular: Heart is normal size.  Aorta normal caliber.

Mediastinum/Nodes: No adenopathy

Lungs/Pleura: Visualized lungs clear.

Upper Abdomen: Visualized upper abdomen demonstrates no acute
findings.

Musculoskeletal: Chest wall soft tissues are unremarkable. No acute
bony abnormality.
IMPRESSION: No acute or significant extracardiac abnormality.

## 2022-06-11 ENCOUNTER — Encounter (HOSPITAL_COMMUNITY): Payer: Self-pay | Admitting: *Deleted
